# Patient Record
Sex: Female | Born: 1965
Health system: Southern US, Community
[De-identification: ages and names within clinical notes are randomized; demographics above are authoritative.]

## PROBLEM LIST (undated history)

## (undated) DIAGNOSIS — J302 Other seasonal allergic rhinitis: Secondary | ICD-10-CM

## (undated) DIAGNOSIS — F419 Anxiety disorder, unspecified: Secondary | ICD-10-CM

## (undated) DIAGNOSIS — I10 Essential (primary) hypertension: Secondary | ICD-10-CM

## (undated) DIAGNOSIS — E7800 Pure hypercholesterolemia, unspecified: Secondary | ICD-10-CM

## (undated) DIAGNOSIS — J329 Chronic sinusitis, unspecified: Secondary | ICD-10-CM

## (undated) DIAGNOSIS — H5210 Myopia, unspecified eye: Secondary | ICD-10-CM

## (undated) HISTORY — PX: NASAL SINUS SURGERY: SHX719

---

## 1998-09-25 ENCOUNTER — Other Ambulatory Visit: Admission: RE | Admit: 1998-09-25 | Discharge: 1998-09-25 | Payer: Self-pay | Admitting: Family Medicine

## 1998-09-27 ENCOUNTER — Ambulatory Visit (HOSPITAL_COMMUNITY): Admission: RE | Admit: 1998-09-27 | Discharge: 1998-09-27 | Payer: Self-pay | Admitting: Family Medicine

## 1998-09-27 ENCOUNTER — Encounter: Payer: Self-pay | Admitting: Family Medicine

## 1999-10-09 ENCOUNTER — Other Ambulatory Visit: Admission: RE | Admit: 1999-10-09 | Discharge: 1999-10-09 | Payer: Self-pay | Admitting: Internal Medicine

## 2000-10-18 ENCOUNTER — Other Ambulatory Visit: Admission: RE | Admit: 2000-10-18 | Discharge: 2000-10-18 | Payer: Self-pay | Admitting: Family Medicine

## 2001-06-27 ENCOUNTER — Emergency Department (HOSPITAL_COMMUNITY): Admission: EM | Admit: 2001-06-27 | Discharge: 2001-06-27 | Payer: Self-pay | Admitting: Emergency Medicine

## 2002-11-09 ENCOUNTER — Other Ambulatory Visit: Admission: RE | Admit: 2002-11-09 | Discharge: 2002-11-09 | Payer: Self-pay | Admitting: Family Medicine

## 2002-11-27 ENCOUNTER — Encounter: Admission: RE | Admit: 2002-11-27 | Discharge: 2002-11-27 | Payer: Self-pay | Admitting: Family Medicine

## 2003-02-07 ENCOUNTER — Emergency Department (HOSPITAL_COMMUNITY): Admission: EM | Admit: 2003-02-07 | Discharge: 2003-02-07 | Payer: Self-pay | Admitting: Emergency Medicine

## 2003-06-23 ENCOUNTER — Emergency Department (HOSPITAL_COMMUNITY): Admission: EM | Admit: 2003-06-23 | Discharge: 2003-06-23 | Payer: Self-pay | Admitting: Emergency Medicine

## 2003-08-06 ENCOUNTER — Encounter: Admission: RE | Admit: 2003-08-06 | Discharge: 2003-09-14 | Payer: Self-pay | Admitting: Orthopedic Surgery

## 2004-03-20 ENCOUNTER — Emergency Department (HOSPITAL_COMMUNITY): Admission: EM | Admit: 2004-03-20 | Discharge: 2004-03-20 | Payer: Self-pay | Admitting: Emergency Medicine

## 2009-10-17 ENCOUNTER — Encounter: Admission: RE | Admit: 2009-10-17 | Discharge: 2009-10-17 | Payer: Self-pay | Admitting: Family Medicine

## 2010-02-16 ENCOUNTER — Encounter: Payer: Self-pay | Admitting: Family Medicine

## 2010-05-23 ENCOUNTER — Other Ambulatory Visit: Payer: Self-pay | Admitting: Family Medicine

## 2010-05-23 ENCOUNTER — Ambulatory Visit
Admission: RE | Admit: 2010-05-23 | Discharge: 2010-05-23 | Disposition: A | Discharge status: Home / Self Care | Payer: 59 | Source: Ambulatory Visit | Attending: Family Medicine | Admitting: Family Medicine

## 2010-05-23 DIAGNOSIS — R52 Pain, unspecified: Secondary | ICD-10-CM

## 2010-05-23 DIAGNOSIS — R1031 Right lower quadrant pain: Secondary | ICD-10-CM

## 2010-05-23 MED ORDER — IOHEXOL 300 MG/ML  SOLN
100.0000 mL | Freq: Once | INTRAMUSCULAR | Status: AC | PRN
  Administered 2010-05-23: 100 mL via INTRAVENOUS

## 2011-02-05 ENCOUNTER — Encounter (HOSPITAL_COMMUNITY): Payer: Self-pay | Admitting: Emergency Medicine

## 2011-02-05 ENCOUNTER — Emergency Department (INDEPENDENT_AMBULATORY_CARE_PROVIDER_SITE_OTHER)
Admission: EM | Admit: 2011-02-05 | Discharge: 2011-02-05 | Disposition: A | Discharge status: Home / Self Care | Payer: 59 | Source: Home / Self Care | Attending: Family Medicine | Admitting: Family Medicine

## 2011-02-05 DIAGNOSIS — R599 Enlarged lymph nodes, unspecified: Secondary | ICD-10-CM

## 2011-02-05 MED ORDER — IBUPROFEN 800 MG PO TABS: 800.0000 mg | ORAL_TABLET | Freq: Three times a day (TID) | ORAL | Status: AC

## 2011-02-05 MED ORDER — AMOXICILLIN 500 MG PO CAPS: 500.0000 mg | ORAL_CAPSULE | Freq: Three times a day (TID) | ORAL | Status: AC

## 2011-02-05 NOTE — ED Provider Notes (Signed)
History     CSN: [id number]  Arrival date & time 02/05/11  1447   First MD Initiated Contact with Patient 02/05/11 1705      Chief Complaint  Patient presents with  . Otalgia    (Consider location/radiation/quality/duration/timing/severity/associated sxs/prior treatment) Patient is a 46 y.o. female presenting with ear pain. The history is provided by the patient. No language interpreter was used.  Otalgia This is a new problem. The current episode started 3 to 5 hours ago. There is pain in the right ear. The problem occurs constantly. The problem has been gradually worsening. There has been no fever. The pain is at a severity of 6/10. The pain is moderate. Pertinent negatives include no ear discharge. Her past medical history does not include chronic ear infection.  Pt complains of pain around right ear and below right ear.  History reviewed. No pertinent past medical history.  History reviewed. No pertinent past surgical history.  History reviewed. No pertinent family history.  History  Substance Use Topics  . Smoking status: Current Everyday Smoker -- 0.5 packs/day    Types: Cigarettes  . Smokeless tobacco: Not on file  . Alcohol Use: No    OB History    Grav Para Term Preterm Abortions TAB SAB Ect Mult Living                  Review of Systems  HENT: Positive for ear pain. Negative for ear discharge.   All other systems reviewed and are negative.    Allergies  Review of patient's allergies indicates no known allergies.  Home Medications  No current outpatient prescriptions on file.  BP 139/89  Pulse 66  Temp 97.2 F (36.2 C)  Resp 22  SpO2 100%  LMP 12/11/2010  Physical Exam  Vitals reviewed. Constitutional: She appears well-developed and well-nourished.  HENT:  Head: Normocephalic and atraumatic.  Right Ear: External ear normal.  Left Ear: External ear normal.  Nose: Nose normal.  Mouth/Throat: Oropharynx is clear and moist.  Eyes: Conjunctivae  and EOM are normal. Pupils are equal, round, and reactive to light.  Neck: Normal range of motion.  Cardiovascular: Normal rate.   Pulmonary/Chest: Effort normal.  Abdominal: Soft.  Musculoskeletal: Normal range of motion.  Lymphadenopathy:    She has cervical adenopathy.  Neurological: She is alert.  Skin: Skin is warm and dry.  Psychiatric: She has a normal mood and affect.    ED Course  Procedures (including critical care time)  Labs Reviewed - No data to display No results found.   No diagnosis found.    MDM  Rx for amoxicillian and ibuprofen        Langston Masker, [place] 02/05/11 1709

## 2011-02-05 NOTE — ED Notes (Signed)
Pt c/o right ear pain starting today. Also has some dizziness, nausea, and diarrhea.

## 2011-02-06 NOTE — ED Provider Notes (Signed)
Medical screening examination/treatment/procedure(s) were performed by non-physician practitioner and as supervising physician I was immediately available for consultation/collaboration.  Corrie Mckusick, MD 02/06/11 1015

## 2013-12-18 ENCOUNTER — Emergency Department (HOSPITAL_COMMUNITY)
Admission: EM | Admit: 2013-12-18 | Discharge: 2013-12-18 | Disposition: A | Discharge status: Home / Self Care | Payer: Managed Care, Other (non HMO) | Attending: Emergency Medicine | Admitting: Emergency Medicine

## 2013-12-18 ENCOUNTER — Emergency Department (INDEPENDENT_AMBULATORY_CARE_PROVIDER_SITE_OTHER)
Admission: EM | Admit: 2013-12-18 | Discharge: 2013-12-18 | Disposition: A | Discharge status: Home / Self Care | Payer: Managed Care, Other (non HMO) | Source: Home / Self Care | Attending: Family Medicine | Admitting: Family Medicine

## 2013-12-18 ENCOUNTER — Encounter (HOSPITAL_COMMUNITY): Payer: Self-pay | Admitting: Emergency Medicine

## 2013-12-18 ENCOUNTER — Encounter (HOSPITAL_COMMUNITY): Payer: Self-pay | Admitting: Family Medicine

## 2013-12-18 DIAGNOSIS — Z7952 Long term (current) use of systemic steroids: Secondary | ICD-10-CM | POA: Insufficient documentation

## 2013-12-18 DIAGNOSIS — R109 Unspecified abdominal pain: Secondary | ICD-10-CM

## 2013-12-18 DIAGNOSIS — R197 Diarrhea, unspecified: Secondary | ICD-10-CM | POA: Insufficient documentation

## 2013-12-18 DIAGNOSIS — Z87891 Personal history of nicotine dependence: Secondary | ICD-10-CM | POA: Diagnosis not present

## 2013-12-18 DIAGNOSIS — R112 Nausea with vomiting, unspecified: Secondary | ICD-10-CM | POA: Diagnosis not present

## 2013-12-18 DIAGNOSIS — R1031 Right lower quadrant pain: Secondary | ICD-10-CM

## 2013-12-18 DIAGNOSIS — Z79899 Other long term (current) drug therapy: Secondary | ICD-10-CM | POA: Insufficient documentation

## 2013-12-18 LAB — POCT URINALYSIS DIP (DEVICE)
Bilirubin Urine: NEGATIVE
GLUCOSE, UA: NEGATIVE mg/dL
Ketones, ur: NEGATIVE mg/dL
LEUKOCYTES UA: NEGATIVE
NITRITE: NEGATIVE
Protein, ur: NEGATIVE mg/dL
Specific Gravity, Urine: 1.015 (ref 1.005–1.030)
Urobilinogen, UA: 0.2 mg/dL (ref 0.0–1.0)
pH: 6 (ref 5.0–8.0)

## 2013-12-18 LAB — CBC WITH DIFFERENTIAL/PLATELET
BASOS ABS: 0 10*3/uL (ref 0.0–0.1)
Basophils Relative: 0 % (ref 0–1)
EOS ABS: 0.2 10*3/uL (ref 0.0–0.7)
Eosinophils Relative: 3 % (ref 0–5)
HCT: 41.2 % (ref 36.0–46.0)
Hemoglobin: 13.9 g/dL (ref 12.0–15.0)
LYMPHS ABS: 1.6 10*3/uL (ref 0.7–4.0)
LYMPHS PCT: 21 % (ref 12–46)
MCH: 30 pg (ref 26.0–34.0)
MCHC: 33.7 g/dL (ref 30.0–36.0)
MCV: 89 fL (ref 78.0–100.0)
Monocytes Absolute: 0.4 10*3/uL (ref 0.1–1.0)
Monocytes Relative: 6 % (ref 3–12)
NEUTROS PCT: 70 % (ref 43–77)
Neutro Abs: 5.4 10*3/uL (ref 1.7–7.7)
PLATELETS: 372 10*3/uL (ref 150–400)
RBC: 4.63 MIL/uL (ref 3.87–5.11)
RDW: 13.5 % (ref 11.5–15.5)
WBC: 7.7 10*3/uL (ref 4.0–10.5)

## 2013-12-18 LAB — COMPREHENSIVE METABOLIC PANEL
ALT: 9 U/L (ref 0–35)
AST: 19 U/L (ref 0–37)
Albumin: 3.9 g/dL (ref 3.5–5.2)
Alkaline Phosphatase: 86 U/L (ref 39–117)
Anion gap: 16 — ABNORMAL HIGH (ref 5–15)
BUN: 11 mg/dL (ref 6–23)
CALCIUM: 9.4 mg/dL (ref 8.4–10.5)
CO2: 22 meq/L (ref 19–32)
Chloride: 99 mEq/L (ref 96–112)
Creatinine, Ser: 0.66 mg/dL (ref 0.50–1.10)
GFR calc Af Amer: >90 mL/min (ref >90)
GFR calc non Af Amer: >90 mL/min (ref >90)
Glucose, Bld: 85 mg/dL (ref 70–99)
Potassium: 4.3 mEq/L (ref 3.7–5.3)
SODIUM: 137 meq/L (ref 137–147)
TOTAL PROTEIN: 8 g/dL (ref 6.0–8.3)
Total Bilirubin: 0.2 mg/dL — ABNORMAL LOW (ref 0.3–1.2)

## 2013-12-18 LAB — LIPASE, BLOOD: Lipase: 32 U/L (ref 11–59)

## 2013-12-18 MED ORDER — MORPHINE SULFATE 4 MG/ML IJ SOLN
4.0000 mg | Freq: Once | INTRAMUSCULAR | Status: AC
Start: 2013-12-18 — End: 2013-12-18
  Administered 2013-12-18: 4 mg via INTRAVENOUS
  Filled 2013-12-18: qty 1

## 2013-12-18 MED ORDER — ONDANSETRON HCL 4 MG/2ML IJ SOLN
4.0000 mg | Freq: Once | INTRAMUSCULAR | Status: AC
  Administered 2013-12-18: 4 mg via INTRAVENOUS
  Filled 2013-12-18: qty 2

## 2013-12-18 MED ORDER — ONDANSETRON 4 MG PO TBDP
4.0000 mg | ORAL_TABLET | Freq: Once | ORAL | Status: AC
  Administered 2013-12-18: 4 mg via ORAL

## 2013-12-18 MED ORDER — SODIUM CHLORIDE 0.9 % IV BOLUS (SEPSIS)
1000.0000 mL | Freq: Once | INTRAVENOUS | Status: AC
Start: 2013-12-18 — End: 2013-12-18
  Administered 2013-12-18: 1000 mL via INTRAVENOUS

## 2013-12-18 MED ORDER — ONDANSETRON 4 MG PO TBDP: 4.0000 mg | ORAL_TABLET | Freq: Three times a day (TID) | ORAL | Status: DC | PRN

## 2013-12-18 MED ORDER — OXYCODONE-ACETAMINOPHEN 5-325 MG PO TABS: 1.0000 | ORAL_TABLET | ORAL | Status: DC | PRN

## 2013-12-18 MED ORDER — ONDANSETRON 4 MG PO TBDP
ORAL_TABLET | ORAL | Status: AC
  Filled 2013-12-18: qty 1

## 2013-12-18 NOTE — ED Provider Notes (Signed)
CSN: [id number]     Arrival date & time 12/18/13  1359 History   First MD Initiated Contact with Patient 12/18/13 1505     Chief Complaint  Patient presents with  . Abdominal Pain     (Consider location/radiation/quality/duration/timing/severity/associated sxs/prior Treatment) The history is provided by the patient and medical records.    This is a 48 year old female with no significant past medical history presenting to the ED for abdominal cramping and diarrhea. Patient states symptoms initially started on 12/14/2013 with nausea, vomiting, and nonbloody diarrhea. States since this time vomiting has resolved but she remains nauseated. She continues having 4-5 watery stools a day.  Denies melena or hematochezia.  States yesterday she developed abdominal pain, worse in her right lower quadrant, but states pain is all throughout her abdomen.  Pain described as a constant, cramping sensation like her abdomen is "knotted up."  She denies fever but has had subjective chills.  Patient has been able to drink water and ginger ale, but has not has not had much of an appetite.  No prior abdominal surgeries.  VS stable on arrival.  History reviewed. No pertinent past medical history. Past Surgical History  Procedure Laterality Date  . Nasal sinus surgery     History reviewed. No pertinent family history. History  Substance Use Topics  . Smoking status: Former Smoker -- 0.50 packs/day    Types: Cigarettes  . Smokeless tobacco: Not on file  . Alcohol Use: No   OB History    No data available     Review of Systems  Gastrointestinal: Positive for nausea, vomiting and diarrhea.  All other systems reviewed and are negative.     Allergies  Review of patient's allergies indicates no known allergies.  Home Medications   Prior to Admission medications   Medication Sig Start Date End Date Taking? Authorizing Provider  cetirizine (ZYRTEC) 10 MG tablet Take 10 mg by mouth daily.    Historical  Provider, MD  fluticasone (FLONASE) 50 MCG/ACT nasal spray Place into both nostrils daily.    Historical Provider, MD   LMP 12/11/2010   Physical Exam  Constitutional: She is oriented to person, place, and time. She appears well-developed and well-nourished.  HENT:  Head: Normocephalic and atraumatic.  Mouth/Throat: Oropharynx is clear and moist.  Eyes: Conjunctivae and EOM are normal. Pupils are equal, round, and reactive to light.  Neck: Normal range of motion.  Cardiovascular: Normal rate, regular rhythm and normal heart sounds.   Pulmonary/Chest: Effort normal and breath sounds normal. No respiratory distress. She has no wheezes.  Abdominal: Soft. Bowel sounds are normal. There is generalized tenderness. There is no CVA tenderness, no tenderness at McBurney's point and negative Murphy's sign.  Abdomen soft, nondistended, generalized discomfort without focal tenderness; no tenderness at McBurney's point; no rebound or guarding  Musculoskeletal: Normal range of motion.  Neurological: She is alert and oriented to person, place, and time.  Skin: Skin is warm and dry.  Psychiatric: She has a normal mood and affect.  Nursing note and vitals reviewed.   ED Course  Procedures (including critical care time) Labs Review Labs Reviewed  COMPREHENSIVE METABOLIC PANEL - Abnormal; Notable for the following:    Total Bilirubin 0.2 (*)    Anion gap 16 (*)    All other components within normal limits  CBC WITH DIFFERENTIAL  LIPASE, BLOOD    Imaging Review No results found.   EKG Interpretation None      MDM   Final diagnoses:  Abdominal pain, unspecified abdominal location  Nausea vomiting and diarrhea   48 year old female with gastroenteritis type symptoms for the past 4 days now with abdominal pain. Seen in urgent care and sent to the ED for further evaluation. On exam, patient is afebrile and overall nontoxic in appearance. She has diffuse generalized abdominal discomfort  without focal tenderness. Specifically she has no tenderness at McBurney's point. Labs pending at this time. Patient given IV fluids, morphine, and Zofran. Will reassess.  Lab work reassuring, no leukocytosis or signs of dehydration. After medications, patient states she is feeling significantly better. She denies any abdominal discomfort at this time, abdominal exam benign at this time.  Discussed possibility of CT scan to ensure no abnormalities, patient states she is feeling better and is comfortable holding off at this time.  Will initiate by mouth challenge, likely discharge.  5:34 PM Patient reassessed.  She states she is feeling tremendously better.  She has been able to drink full can of gingerale and several crackers without further nausea or vomiting.  No diarrhea while in the ED.  Abdominal exam remains benign at this time, VS stable.  At this time, low suspicion for acute abdominal pathology including small bowel obstruction, appendicitis, diverticulitis, bowel perforation, intra-abdominal abscess, etc. Symptoms more likely due to gastroenteritis. I have again discussed the possibility of obtaining CT abdomen and pelvis to ensure no abnormalities, patient states she is feeling much better and is compared bone-holding on scan at this time. Patient instructed on supportive care home, encouraged brat diet and progress back to normal as tolerated. Rx Percocet and Zofran.  Discussed plan with patient, he/she acknowledged understanding and agreed with plan of care.  Return precautions given for new or worsening symptoms.   Larene Pickett, PA-C 12/18/13 2129  Mirna Mires, MD 12/25/13 0730

## 2013-12-18 NOTE — Discharge Instructions (Signed)
As we discussed, I have concerns about the possibility of your having acute appendicitis. Please transport yourself to [facility] ER upon leaving [facility] for additional evaluation. Please do not eat or drink anything along the way.  Abdominal Pain Many things can cause abdominal pain. Usually, abdominal pain is not caused by a disease and will improve without treatment. It can often be observed and treated at home. Your health care provider will do a physical exam and possibly order blood tests and X-rays to help determine the seriousness of your pain. However, in many cases, more time must pass before a clear cause of the pain can be found. Before that point, your health care provider may not know if you need more testing or further treatment. HOME CARE INSTRUCTIONS  Monitor your abdominal pain for any changes. The following actions may help to alleviate any discomfort you are experiencing:  Only take over-the-counter or prescription medicines as directed by your health care provider.  Do not take laxatives unless directed to do so by your health care provider.  Try a clear liquid diet (broth, tea, or water) as directed by your health care provider. Slowly move to a bland diet as tolerated. SEEK MEDICAL CARE IF:  You have unexplained abdominal pain.  You have abdominal pain associated with nausea or diarrhea.  You have pain when you urinate or have a bowel movement.  You experience abdominal pain that wakes you in the night.  You have abdominal pain that is worsened or improved by eating food.  You have abdominal pain that is worsened with eating fatty foods.  You have a fever. SEEK IMMEDIATE MEDICAL CARE IF:   Your pain does not go away within 2 hours.  You keep throwing up (vomiting).  Your pain is felt only in portions of the abdomen, such as the right side or the left lower portion of the abdomen.  You pass bloody or black tarry stools. MAKE SURE YOU:  Understand these  instructions.   Will watch your condition.   Will get help right away if you are not doing well or get worse.  Document Released: 10/22/2004 Document Revised: 01/17/2013 Document Reviewed: 09/21/2012 [facility] Patient Information 2015 [place], [place]. This information is not intended to replace advice given to you by your health care provider. Make sure you discuss any questions you have with your health care provider.

## 2013-12-18 NOTE — ED Notes (Signed)
Pt sts abdominal cramping ans diarrhea since Thursday and sts now RLQ pain and nausea. Sent here from [facility] r/o appy

## 2013-12-18 NOTE — ED Notes (Signed)
Pt is going to the ED to be further evaluated for Abdominal pain via private vechicle.

## 2013-12-18 NOTE — ED Provider Notes (Signed)
CSN: [id number]     Arrival date & time 12/18/13  1147 History   None    Chief Complaint  Patient presents with  . Abdominal Pain  . Nausea  . Diarrhea   (Consider location/radiation/quality/duration/timing/severity/associated sxs/prior Treatment) HPI Comments: Patient states she developed cramping abdominal pain with associated nausea, vomiting, diarrhea and anorexia on 12/14/2013. Reports she is no longer vomiting but remains nauseous and is having 4-5 diarrhea stools a day. Reports development of RLQ abdominal pain over the past 12-24 hours. NO fever. No bloody stools. Denies urinary issues. No vaginal issues Reports herself to be otherwise healthy No previous abdominal surgery Is menopausal PCP: [facility] Works in lab at [employer]  Patient is a 48 y.o. female presenting with abdominal pain and diarrhea. The history is provided by the patient.  Abdominal Pain Associated symptoms: diarrhea   Diarrhea Associated symptoms: abdominal pain     History reviewed. No pertinent past medical history. Past Surgical History  Procedure Laterality Date  . Nasal sinus surgery     History reviewed. No pertinent family history. History  Substance Use Topics  . Smoking status: Former Smoker -- 0.50 packs/day    Types: Cigarettes  . Smokeless tobacco: Not on file  . Alcohol Use: No   OB History    No data available     Review of Systems  Gastrointestinal: Positive for abdominal pain and diarrhea.  All other systems reviewed and are negative.   Allergies  Review of patient's allergies indicates no known allergies.  Home Medications   Prior to Admission medications   Medication Sig Start Date End Date Taking? Authorizing Provider  cetirizine (ZYRTEC) 10 MG tablet Take 10 mg by mouth daily.   Yes Historical Provider, MD  fluticasone (FLONASE) 50 MCG/ACT nasal spray Place into both nostrils daily.   Yes Historical Provider, MD   BP 116/63 mmHg  Pulse 63   Temp(Src) 98.6 F (37 C) (Oral)  Resp 16  SpO2 99%  LMP 12/11/2010 Physical Exam  Constitutional: She is oriented to person, place, and time. She appears well-developed and well-nourished. No distress.  HENT:  Head: Normocephalic and atraumatic.  Eyes: Conjunctivae are normal. No scleral icterus.  Cardiovascular: Normal rate, regular rhythm and normal heart sounds.   Pulmonary/Chest: Effort normal and breath sounds normal. No respiratory distress. She has no wheezes.  Abdominal: Soft. Normal appearance and bowel sounds are normal. There is tenderness in the right lower quadrant. There is no rigidity, no rebound, no guarding and no CVA tenderness.    Musculoskeletal: Normal range of motion.  Neurological: She is alert and oriented to person, place, and time.  Skin: Skin is warm and dry.  Psychiatric: She has a normal mood and affect. Her behavior is normal.  Nursing note and vitals reviewed.   ED Course  Procedures (including critical care time) Labs Review Labs Reviewed - No data to display  Imaging Review No results found.   MDM   1. Right lower quadrant abdominal pain    UA neg. Given 4mg  ODT zofran at [facility] for nausea I have concern about area of focal tenderness in RLQ in setting of nausea, anorexia and diarrhea.  Explained my concern regarding possibility of acute appendicitis to patient and she understand the need for additional evaluation. Would like to transport herself via private vehicle to [facility] ER for further evaluation. Advised to remain NPO.     Lutricia Feil, [place] 12/18/13 1357

## 2013-12-18 NOTE — ED Notes (Signed)
Pt states that she has had nausea diarrhea and abd cramping since 12/14/2013. Pt states only 1 episode of emesis on 12/14/2013.

## 2013-12-18 NOTE — Discharge Instructions (Signed)
Take the prescribed medication as directed. May wish to start with BRAT diet (bananas, rice, apples, toast) and progress back to normal as tolerated.  Drink plenty of fluids to stay hydrated. Return to the ED for new or worsening symptoms--- severe abdominal pain, uncontrollable nausea and vomiting, high fever, etc.

## 2014-04-25 ENCOUNTER — Encounter: Payer: Self-pay | Admitting: Medical

## 2014-04-25 ENCOUNTER — Ambulatory Visit (INDEPENDENT_AMBULATORY_CARE_PROVIDER_SITE_OTHER): Payer: 59 | Admitting: Medical

## 2014-04-25 VITALS — BP 120/80 | HR 70 | Temp 97.6°F | Resp 15 | Wt 176.0 lb

## 2014-04-25 DIAGNOSIS — J0100 Acute maxillary sinusitis, unspecified: Secondary | ICD-10-CM

## 2014-04-25 DIAGNOSIS — J309 Allergic rhinitis, unspecified: Secondary | ICD-10-CM

## 2014-04-25 MED ORDER — AMOXICILLIN 500 MG PO TABS: ORAL_TABLET | ORAL | Status: DC

## 2014-04-25 NOTE — Progress Notes (Signed)
  Subjective:  Maureen Hickman is a 49 y.o. female who presents as a new patient.  Here for evaluation and treatment of allergic symptoms. Symptoms include: itchy eyes, itchy nose, nasal congestion, postnasal drip, sinus pressure, sneezing and dizzy and some loose stool and are present year round, but sees changes at each season. Precipitants include: pollen. Treatment currently includes Flonase and Zyrtec.  Hx/o sinus surgery 11/ 2014.  Been on flonase since 11/2012.  Currently in last few days has right sinus pressure and upper teeth pain.  Sense of smell decreased, some fatigue.  Some yellow nasal mucous.  She notes prior to sinus surgery in 11/2012, seems to be on antibiotic every other month.  Does neti pot.  No other aggravating or relieving factors. No other complaint.  The following portions of the patient's history were reviewed and updated as appropriate: allergies, current medications, past family history, past medical history, past social history, past surgical history and problem list.  ROS as in subjective   Objective: BP 120/80 mmHg  Pulse 70  Temp(Src) 97.6 F (36.4 C) (Oral)  Resp 15  Wt 176 lb (79.833 kg)  LMP 12/11/2010  General appearance: Alert, WD/WN, no distress                             Skin: warm, no rash                           Head: +right maxillary sinus tenderness                            Eyes: conjunctiva normal, corneas clear, PERRLA                            Ears: pearly TMs, external ear canals normal                          Nose: septum midline, nares patent s/p surgery, no erythema,mucoid discharge                 Mouth/throat: MMM, tongue normal, no pharyngeal erythema or exudate                           Neck: supple, no adenopathy, no thyromegaly, non tender                         Lungs: CTA bilaterally, no wheezes, rales, or rhonchi   Assessment:   Encounter Diagnoses  Name Primary?  . Acute maxillary sinusitis, recurrence not  specified Yes  . Allergic rhinitis, unspecified allergic rhinitis type      Plan:   C/t zyrtec or try change to allegra, c/t flonase, good hydration, avoid triggers if possible Begin amoxicillin, nasal saline, and f/u if not much improved in 1-2 wk

## 2015-04-22 DIAGNOSIS — F419 Anxiety disorder, unspecified: Secondary | ICD-10-CM | POA: Diagnosis not present

## 2015-04-22 DIAGNOSIS — I10 Essential (primary) hypertension: Secondary | ICD-10-CM | POA: Diagnosis not present

## 2015-05-20 DIAGNOSIS — F419 Anxiety disorder, unspecified: Secondary | ICD-10-CM | POA: Diagnosis not present

## 2015-05-20 DIAGNOSIS — I10 Essential (primary) hypertension: Secondary | ICD-10-CM | POA: Diagnosis not present

## 2015-05-20 DIAGNOSIS — G43909 Migraine, unspecified, not intractable, without status migrainosus: Secondary | ICD-10-CM | POA: Diagnosis not present

## 2015-05-27 DIAGNOSIS — I10 Essential (primary) hypertension: Secondary | ICD-10-CM | POA: Diagnosis not present

## 2015-05-27 DIAGNOSIS — G43909 Migraine, unspecified, not intractable, without status migrainosus: Secondary | ICD-10-CM | POA: Diagnosis not present

## 2015-05-27 DIAGNOSIS — F419 Anxiety disorder, unspecified: Secondary | ICD-10-CM | POA: Diagnosis not present

## 2015-06-14 DIAGNOSIS — H524 Presbyopia: Secondary | ICD-10-CM | POA: Diagnosis not present

## 2015-06-14 DIAGNOSIS — H5212 Myopia, left eye: Secondary | ICD-10-CM | POA: Diagnosis not present

## 2015-06-14 DIAGNOSIS — H52221 Regular astigmatism, right eye: Secondary | ICD-10-CM | POA: Diagnosis not present

## 2015-07-16 DIAGNOSIS — J019 Acute sinusitis, unspecified: Secondary | ICD-10-CM | POA: Diagnosis not present

## 2015-09-05 DIAGNOSIS — I10 Essential (primary) hypertension: Secondary | ICD-10-CM | POA: Diagnosis not present

## 2015-09-05 DIAGNOSIS — G43909 Migraine, unspecified, not intractable, without status migrainosus: Secondary | ICD-10-CM | POA: Diagnosis not present

## 2015-09-05 DIAGNOSIS — F419 Anxiety disorder, unspecified: Secondary | ICD-10-CM | POA: Diagnosis not present

## 2015-09-05 DIAGNOSIS — N951 Menopausal and female climacteric states: Secondary | ICD-10-CM | POA: Diagnosis not present

## 2015-12-05 MED FILL — cloNIDine HCL 0.2 MG TABS: 0.2 | 30 days supply | Qty: 60 | Fill #0

## 2016-02-05 MED FILL — cloNIDine HCL 0.2 MG TABS: 0.2 | 30 days supply | Qty: 60 | Fill #1

## 2016-02-11 DIAGNOSIS — F419 Anxiety disorder, unspecified: Secondary | ICD-10-CM | POA: Diagnosis not present

## 2016-02-11 DIAGNOSIS — I10 Essential (primary) hypertension: Secondary | ICD-10-CM | POA: Diagnosis not present

## 2016-02-11 DIAGNOSIS — J019 Acute sinusitis, unspecified: Secondary | ICD-10-CM | POA: Diagnosis not present

## 2016-04-13 DIAGNOSIS — G43909 Migraine, unspecified, not intractable, without status migrainosus: Secondary | ICD-10-CM | POA: Diagnosis not present

## 2016-04-13 DIAGNOSIS — F419 Anxiety disorder, unspecified: Secondary | ICD-10-CM | POA: Diagnosis not present

## 2016-04-13 DIAGNOSIS — I10 Essential (primary) hypertension: Secondary | ICD-10-CM | POA: Diagnosis not present

## 2016-04-28 MED FILL — CEFUROXIME AXETIL 500 MG TA: 500 | 10 days supply | Qty: 20 | Fill #0

## 2016-04-28 MED FILL — FLUTICASONE PROP 50 MCG SPR: 50 | 60 days supply | Qty: 16 | Fill #0

## 2016-05-19 DIAGNOSIS — J019 Acute sinusitis, unspecified: Secondary | ICD-10-CM | POA: Diagnosis not present

## 2016-05-19 DIAGNOSIS — G43909 Migraine, unspecified, not intractable, without status migrainosus: Secondary | ICD-10-CM | POA: Diagnosis not present

## 2016-05-19 DIAGNOSIS — I10 Essential (primary) hypertension: Secondary | ICD-10-CM | POA: Diagnosis not present

## 2016-07-16 DIAGNOSIS — I10 Essential (primary) hypertension: Secondary | ICD-10-CM | POA: Diagnosis not present

## 2016-07-16 DIAGNOSIS — G43909 Migraine, unspecified, not intractable, without status migrainosus: Secondary | ICD-10-CM | POA: Diagnosis not present

## 2016-07-16 DIAGNOSIS — F419 Anxiety disorder, unspecified: Secondary | ICD-10-CM | POA: Diagnosis not present

## 2016-07-16 MED FILL — ALPRAZolam 0.5 MG TABS: 0.5 | 30 days supply | Qty: 90 | Fill #0

## 2016-08-13 MED FILL — FLUTICASONE PROP 50 MCG SPR: 50 | 60 days supply | Qty: 16 | Fill #1

## 2016-10-07 DIAGNOSIS — I10 Essential (primary) hypertension: Secondary | ICD-10-CM | POA: Diagnosis not present

## 2016-10-07 DIAGNOSIS — G43909 Migraine, unspecified, not intractable, without status migrainosus: Secondary | ICD-10-CM | POA: Diagnosis not present

## 2016-10-07 DIAGNOSIS — F419 Anxiety disorder, unspecified: Secondary | ICD-10-CM | POA: Diagnosis not present

## 2016-10-07 DIAGNOSIS — J019 Acute sinusitis, unspecified: Secondary | ICD-10-CM | POA: Diagnosis not present

## 2016-10-07 MED FILL — FLUTICASONE PROP 50 MCG SPR: 50 | 60 days supply | Qty: 16 | Fill #0

## 2016-10-07 MED FILL — NOREL AD TABLET: 4-10-325 | 10 days supply | Qty: 40 | Fill #0

## 2016-10-07 MED FILL — AMOXICILLIN 500 MG CAPSULE: 500 | 10 days supply | Qty: 30 | Fill #0

## 2016-10-07 MED FILL — PROMETHAZINE 6.25 MG/5 ML S: 6.25 | 7 days supply | Qty: 140 | Fill #0

## 2016-11-25 DIAGNOSIS — G43909 Migraine, unspecified, not intractable, without status migrainosus: Secondary | ICD-10-CM | POA: Diagnosis not present

## 2016-11-25 DIAGNOSIS — F419 Anxiety disorder, unspecified: Secondary | ICD-10-CM | POA: Diagnosis not present

## 2016-11-25 DIAGNOSIS — I10 Essential (primary) hypertension: Secondary | ICD-10-CM | POA: Diagnosis not present

## 2016-12-01 MED FILL — AMOXICILLIN 500 MG CAPSULE: 500 | 10 days supply | Qty: 30 | Fill #1

## 2016-12-01 MED FILL — NOREL AD TABLET: 4-10-325 | 10 days supply | Qty: 40 | Fill #1

## 2017-02-16 MED FILL — FLUTICASONE PROP 50 MCG SPR: 50 | 60 days supply | Qty: 16 | Fill #1

## 2017-02-25 DIAGNOSIS — J069 Acute upper respiratory infection, unspecified: Secondary | ICD-10-CM | POA: Diagnosis not present

## 2017-02-25 DIAGNOSIS — I10 Essential (primary) hypertension: Secondary | ICD-10-CM | POA: Diagnosis not present

## 2017-02-25 DIAGNOSIS — F419 Anxiety disorder, unspecified: Secondary | ICD-10-CM | POA: Diagnosis not present

## 2017-03-29 DIAGNOSIS — I10 Essential (primary) hypertension: Secondary | ICD-10-CM | POA: Diagnosis not present

## 2017-03-29 DIAGNOSIS — Z0000 Encounter for general adult medical examination without abnormal findings: Secondary | ICD-10-CM | POA: Diagnosis not present

## 2017-03-29 DIAGNOSIS — Z01419 Encounter for gynecological examination (general) (routine) without abnormal findings: Secondary | ICD-10-CM | POA: Diagnosis not present

## 2017-04-01 MED FILL — ATORVASTATIN 20 MG TABLET: 20 | 90 days supply | Qty: 90 | Fill #0

## 2017-04-19 MED FILL — PROMETHAZINE 6.25 MG/5 ML S: 6.25 | 7 days supply | Qty: 140 | Fill #1

## 2017-04-19 MED FILL — FLUTICASONE PROP 50 MCG SPR: 50 | 60 days supply | Qty: 16 | Fill #2

## 2017-05-17 DIAGNOSIS — F419 Anxiety disorder, unspecified: Secondary | ICD-10-CM | POA: Diagnosis not present

## 2017-05-17 DIAGNOSIS — I10 Essential (primary) hypertension: Secondary | ICD-10-CM | POA: Diagnosis not present

## 2017-05-17 DIAGNOSIS — G43909 Migraine, unspecified, not intractable, without status migrainosus: Secondary | ICD-10-CM | POA: Diagnosis not present

## 2017-05-17 MED FILL — ALPRAZolam 0.5 MG TABS: 0.5 | 30 days supply | Qty: 90 | Fill #0

## 2017-07-26 MED FILL — FLUTICASONE PROP 50 MCG SPR: 50 | 60 days supply | Qty: 16 | Fill #3

## 2017-07-26 MED FILL — ATORVASTATIN 20 MG TABLET: 20 | 30 days supply | Qty: 30 | Fill #1

## 2017-08-05 DIAGNOSIS — F419 Anxiety disorder, unspecified: Secondary | ICD-10-CM | POA: Diagnosis not present

## 2017-08-05 DIAGNOSIS — D2230 Melanocytic nevi of unspecified part of face: Secondary | ICD-10-CM | POA: Diagnosis not present

## 2017-08-05 DIAGNOSIS — I10 Essential (primary) hypertension: Secondary | ICD-10-CM | POA: Diagnosis not present

## 2017-08-05 MED FILL — ALPRAZolam 0.5 MG TABS: 0.5 | 30 days supply | Qty: 90 | Fill #0

## 2017-08-26 DIAGNOSIS — F419 Anxiety disorder, unspecified: Secondary | ICD-10-CM | POA: Diagnosis not present

## 2017-08-26 DIAGNOSIS — K529 Noninfective gastroenteritis and colitis, unspecified: Secondary | ICD-10-CM | POA: Diagnosis not present

## 2017-08-26 MED FILL — AMOXICILLIN 875 MG TABLET: 875 | 5 days supply | Qty: 10 | Fill #0

## 2017-08-26 MED FILL — ONDANSETRON ODT 8 MG TABLET: 8 | 5 days supply | Qty: 20 | Fill #0

## 2017-09-23 MED FILL — PEG-3350 SOLUTION: 420 | 1 days supply | Qty: 4000 | Fill #0

## 2017-10-18 DIAGNOSIS — F419 Anxiety disorder, unspecified: Secondary | ICD-10-CM | POA: Diagnosis not present

## 2017-10-18 DIAGNOSIS — E782 Mixed hyperlipidemia: Secondary | ICD-10-CM | POA: Diagnosis not present

## 2017-10-18 MED FILL — ALPRAZolam 0.5 MG TABS: 0.5 | 30 days supply | Qty: 90 | Fill #0

## 2017-10-18 MED FILL — ATORVASTATIN CALCIUM 20 MG: 20 | 30 days supply | Qty: 30 | Fill #0

## 2017-10-29 DIAGNOSIS — Z1211 Encounter for screening for malignant neoplasm of colon: Secondary | ICD-10-CM | POA: Diagnosis not present

## 2017-10-29 DIAGNOSIS — K648 Other hemorrhoids: Secondary | ICD-10-CM | POA: Diagnosis not present

## 2017-10-29 DIAGNOSIS — D126 Benign neoplasm of colon, unspecified: Secondary | ICD-10-CM | POA: Diagnosis not present

## 2017-10-29 DIAGNOSIS — K644 Residual hemorrhoidal skin tags: Secondary | ICD-10-CM | POA: Diagnosis not present

## 2017-10-29 DIAGNOSIS — K635 Polyp of colon: Secondary | ICD-10-CM | POA: Diagnosis not present

## 2017-11-18 MED FILL — ATORVASTATIN CALCIUM 20 MG: 20 | 30 days supply | Qty: 30 | Fill #1

## 2017-11-23 MED FILL — AMOXICILLIN 875 MG TABLET: 875 | 5 days supply | Qty: 10 | Fill #1

## 2017-12-27 MED FILL — ATORVASTATIN CALCIUM 20 MG: 20 | 30 days supply | Qty: 30 | Fill #2

## 2017-12-28 DIAGNOSIS — F419 Anxiety disorder, unspecified: Secondary | ICD-10-CM | POA: Diagnosis not present

## 2017-12-28 DIAGNOSIS — E782 Mixed hyperlipidemia: Secondary | ICD-10-CM | POA: Diagnosis not present

## 2017-12-28 DIAGNOSIS — I10 Essential (primary) hypertension: Secondary | ICD-10-CM | POA: Diagnosis not present

## 2017-12-28 MED FILL — ALPRAZolam 0.5 MG TABS: 0.5 | 30 days supply | Qty: 90 | Fill #0

## 2018-02-16 MED FILL — ATORVASTATIN CALCIUM 20 MG: 20 | 30 days supply | Qty: 30 | Fill #3

## 2018-03-01 DIAGNOSIS — E782 Mixed hyperlipidemia: Secondary | ICD-10-CM | POA: Diagnosis not present

## 2018-03-01 DIAGNOSIS — I10 Essential (primary) hypertension: Secondary | ICD-10-CM | POA: Diagnosis not present

## 2018-03-01 DIAGNOSIS — F419 Anxiety disorder, unspecified: Secondary | ICD-10-CM | POA: Diagnosis not present

## 2018-03-01 MED FILL — ALPRAZolam 0.5 MG TABS: 0.5 | 30 days supply | Qty: 90 | Fill #0

## 2018-05-17 DIAGNOSIS — J019 Acute sinusitis, unspecified: Secondary | ICD-10-CM | POA: Diagnosis not present

## 2018-05-17 DIAGNOSIS — F419 Anxiety disorder, unspecified: Secondary | ICD-10-CM | POA: Diagnosis not present

## 2018-05-17 DIAGNOSIS — I10 Essential (primary) hypertension: Secondary | ICD-10-CM | POA: Diagnosis not present

## 2018-08-24 DIAGNOSIS — I10 Essential (primary) hypertension: Secondary | ICD-10-CM | POA: Diagnosis not present

## 2018-08-24 DIAGNOSIS — F419 Anxiety disorder, unspecified: Secondary | ICD-10-CM | POA: Diagnosis not present

## 2018-08-24 DIAGNOSIS — G43009 Migraine without aura, not intractable, without status migrainosus: Secondary | ICD-10-CM | POA: Diagnosis not present

## 2018-08-30 DIAGNOSIS — F419 Anxiety disorder, unspecified: Secondary | ICD-10-CM | POA: Diagnosis not present

## 2018-08-30 DIAGNOSIS — I10 Essential (primary) hypertension: Secondary | ICD-10-CM | POA: Diagnosis not present

## 2018-08-30 DIAGNOSIS — E782 Mixed hyperlipidemia: Secondary | ICD-10-CM | POA: Diagnosis not present

## 2018-08-30 DIAGNOSIS — G43009 Migraine without aura, not intractable, without status migrainosus: Secondary | ICD-10-CM | POA: Diagnosis not present

## 2018-09-15 MED FILL — MOMETASONE FUROATE 50 MCG S: 50 | 30 days supply | Qty: 17 | Fill #0

## 2018-09-26 DIAGNOSIS — F419 Anxiety disorder, unspecified: Secondary | ICD-10-CM | POA: Diagnosis not present

## 2018-09-26 DIAGNOSIS — J019 Acute sinusitis, unspecified: Secondary | ICD-10-CM | POA: Diagnosis not present

## 2018-09-26 DIAGNOSIS — I10 Essential (primary) hypertension: Secondary | ICD-10-CM | POA: Diagnosis not present

## 2018-09-27 MED FILL — CLARITHROMYCIN 500 MG TAB: 500 | 7 days supply | Qty: 14 | Fill #0

## 2018-10-28 DIAGNOSIS — G43009 Migraine without aura, not intractable, without status migrainosus: Secondary | ICD-10-CM | POA: Diagnosis not present

## 2018-10-28 DIAGNOSIS — F419 Anxiety disorder, unspecified: Secondary | ICD-10-CM | POA: Diagnosis not present

## 2018-10-28 DIAGNOSIS — I10 Essential (primary) hypertension: Secondary | ICD-10-CM | POA: Diagnosis not present

## 2018-10-28 DIAGNOSIS — E782 Mixed hyperlipidemia: Secondary | ICD-10-CM | POA: Diagnosis not present

## 2018-11-03 MED FILL — MOMETASONE FUROATE 50 MCG S: 50 | 30 days supply | Qty: 17 | Fill #0

## 2018-11-03 MED FILL — CLARITHROMYCIN 500 MG TAB: 500 | 7 days supply | Qty: 14 | Fill #1

## 2018-11-17 DIAGNOSIS — H524 Presbyopia: Secondary | ICD-10-CM | POA: Diagnosis not present

## 2018-11-17 DIAGNOSIS — H5212 Myopia, left eye: Secondary | ICD-10-CM | POA: Diagnosis not present

## 2018-11-17 DIAGNOSIS — H52221 Regular astigmatism, right eye: Secondary | ICD-10-CM | POA: Diagnosis not present

## 2019-01-31 DIAGNOSIS — I10 Essential (primary) hypertension: Secondary | ICD-10-CM | POA: Diagnosis not present

## 2019-01-31 DIAGNOSIS — Z0000 Encounter for general adult medical examination without abnormal findings: Secondary | ICD-10-CM | POA: Diagnosis not present

## 2019-01-31 DIAGNOSIS — E782 Mixed hyperlipidemia: Secondary | ICD-10-CM | POA: Diagnosis not present

## 2019-01-31 DIAGNOSIS — Z13220 Encounter for screening for lipoid disorders: Secondary | ICD-10-CM | POA: Diagnosis not present

## 2019-01-31 DIAGNOSIS — Z13228 Encounter for screening for other metabolic disorders: Secondary | ICD-10-CM | POA: Diagnosis not present

## 2019-01-31 DIAGNOSIS — Z01419 Encounter for gynecological examination (general) (routine) without abnormal findings: Secondary | ICD-10-CM | POA: Diagnosis not present

## 2019-02-02 DIAGNOSIS — M25562 Pain in left knee: Secondary | ICD-10-CM | POA: Diagnosis not present

## 2019-02-20 DIAGNOSIS — G43009 Migraine without aura, not intractable, without status migrainosus: Secondary | ICD-10-CM | POA: Diagnosis not present

## 2019-02-20 DIAGNOSIS — E782 Mixed hyperlipidemia: Secondary | ICD-10-CM | POA: Diagnosis not present

## 2019-02-20 DIAGNOSIS — F419 Anxiety disorder, unspecified: Secondary | ICD-10-CM | POA: Diagnosis not present

## 2019-02-20 DIAGNOSIS — J019 Acute sinusitis, unspecified: Secondary | ICD-10-CM | POA: Diagnosis not present

## 2019-02-20 DIAGNOSIS — I10 Essential (primary) hypertension: Secondary | ICD-10-CM | POA: Diagnosis not present

## 2019-02-20 MED FILL — MOMETASONE FUROATE 50 MCG S: 50 | 30 days supply | Qty: 17 | Fill #0

## 2019-02-20 MED FILL — ROSUVASTATIN CALCIUM 40 MG: 40 | 30 days supply | Qty: 30 | Fill #0

## 2019-02-20 MED FILL — AMOXICILLIN 875 MG TABS: 875 | 5 days supply | Qty: 10 | Fill #0

## 2019-02-20 MED FILL — VASCEPA 1 GM CAPSULE: 1 | 30 days supply | Qty: 120 | Fill #0

## 2019-02-21 MED FILL — UBRELVY 50 MG TABS: 50 | 30 days supply | Qty: 16 | Fill #0

## 2019-04-12 MED FILL — UBRELVY 50 MG TABS: 50 | 30 days supply | Qty: 16 | Fill #1

## 2019-05-11 MED FILL — ROSUVASTATIN CALCIUM 40 MG: 40 | 30 days supply | Qty: 30 | Fill #1

## 2019-05-15 MED FILL — AMOXICILLIN 875 MG TABS: 875 | 5 days supply | Qty: 10 | Fill #1

## 2019-06-01 ENCOUNTER — Ambulatory Visit: Payer: 59 | Admitting: Sports Medicine

## 2019-06-01 ENCOUNTER — Other Ambulatory Visit: Payer: Self-pay

## 2019-06-01 VITALS — BP 132/90 | Ht 63.0 in | Wt 174.0 lb

## 2019-06-01 DIAGNOSIS — M25562 Pain in left knee: Secondary | ICD-10-CM

## 2019-06-01 NOTE — Progress Notes (Signed)
Maureen Hickman - 54 y.o. female MRN [id number]  Date of birth: November 30, 1965  SUBJECTIVE:   CC: left knee pain   54 year old female presenting with left knee pain for the past 5 months.  She reports that she was doing jumping jacks in late December, felt like she overextended her leg laterally, and had acute pain over her lateral knee.  She saw Dr. Berenice Primas at [facility] in early January and had an x-ray of knee that she reported was normal.  She had a corticosteroid injection in her knee at that time which provided relief for 1 month.  Lateral knee pain returned and now she is also having feeling of knee locking, catching, and giving out.  She is unable to walk her dog as she was before the injury.  She is now walking with a cane as she felt like she was unstable and favoring her left side.  She purchased a compression sleeve a few days ago and has been wearing that with improvement in pain and stability.  In February, she started to have tingling from her posterior buttocks down behind her leg to the outside of her left foot.  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 No weakness of left leg. No change in bowel or bladder function.  ROS: negative aside from positives in HPI   PMHx - Updated and reviewed.  Contributory factors include: Negative PSHx - Updated and reviewed.  Contributory factors include:  Negative FHx - Updated and reviewed.  Contributory factors include:  Negative Social Hx - Updated and reviewed. Contributory factors include: Negative Medications - reviewed   DATA REVIEWED: none  PHYSICAL EXAM:  VS: BP:132/90  HR: bpm  TEMP: ( )  RESP:   HT:5\' 3"  (160 cm)   WT:174 lb (78.9 kg)  BMI:30.83 PHYSICAL EXAM: Gen: NAD, alert, cooperative with exam, well-appearing HEENT: clear conjunctiva,  CV:  no edema, capillary refill brisk, normal rate Resp: non-labored Skin: no rashes, normal turgor  Neuro: no gross deficits.    Psych:  alert and oriented  Left Knee: - Inspection: no gross deformity. No swelling/effusion, erythema or bruising. Skin intact - Palpation: TTP over lateral joint line - ROM: full active ROM with flexion and extension in knee and hip- pain with flexion and feeling of knee catching - Strength: 5/5 strength - Neuro/vasc: NV intact - Special Tests: - LIGAMENTS: negative anterior and posterior drawer, no MCL or LCL laxity  -- MENISCUS: positive McMurray's, positive Thessaly  -- PF JOINT: nml patellar mobility bilaterally  Hips: normal ROM, negative FABER and FADIR bilaterally  Lumbar spine: - Inspection: no gross deformity or asymmetry, swelling or ecchymosis - Palpation: No TTP over the spinous processes, paraspinal muscles, or SI joints b/l - ROM: full active ROM of the lumbar spine in flexion and extension without pain - Strength: 5/5 strength of lower extremity in L4-S1 nerve root distributions b/l; normal gait - Neuro: sensation intact in the L4-S1 nerve root distribution b/l, 2+ L4 and S1 reflexes - Special testing: Negative straight leg raise, negative slump, negative Stork test, Negative FABER  MSK ultrasound knee: Images were obtained both in the transverse and longitudinal plane. Patellar and quadriceps tendons were well visualized with no abnormalities. No effusion. Medial meniscus with no abnormality Lateral meniscus abnormal in appearance with hypoechoic change in anterior aspect resembling a tear.  Impression: lateral meniscus appearing frayed with anterior tear  ASSESSMENT & PLAN:  54 year old female presenting with 5 months of left lateral knee pain with exam and ultrasound findings consistent with lateral meniscal tear.  She is having mechanical symptoms and persistent pain that has not improved with conservative management.  Will obtain MRI to evaluate for lateral meniscal tear.  In the interim, continue compression, ice, NSAIDs as needed for pain.  The sciatica  that she is experiencing is likely from gait alteration causing irritation in her back.  Provided low back and abdominal exercises to help with this.  Will call with results of MRI and next steps.  Patient seen and evaluated with the sports medicine fellow.  I agree with the above plan of care.  MRI specifically to rule out a lateral meniscal tear which may need arthroscopic intervention.  Phone follow-up with those results when available and we will delineate further treatment based on those findings.

## 2019-06-21 ENCOUNTER — Ambulatory Visit
Admission: RE | Admit: 2019-06-21 | Discharge: 2019-06-21 | Disposition: A | Discharge status: Home / Self Care | Payer: 59 | Source: Ambulatory Visit | Attending: Sports Medicine | Admitting: Sports Medicine

## 2019-06-21 ENCOUNTER — Other Ambulatory Visit: Payer: Self-pay

## 2019-06-21 DIAGNOSIS — M25562 Pain in left knee: Secondary | ICD-10-CM

## 2019-06-21 DIAGNOSIS — S83242A Other tear of medial meniscus, current injury, left knee, initial encounter: Secondary | ICD-10-CM | POA: Diagnosis not present

## 2019-06-23 ENCOUNTER — Telehealth: Payer: Self-pay | Admitting: Sports Medicine

## 2019-06-23 NOTE — Telephone Encounter (Signed)
  I spoke with the patient on the phone today after reviewing MRI findings of her left knee.  She has an early root tear through the posterior horn of the medial meniscus with incomplete meniscal detachment.  Conservative treatment to date has consisted of a cortisone injection and a compression sleeve.  Her symptoms are currently tolerable but I did explain that the next step in treatment would be to consider arthroscopy.  She has seen Dr. Berenice Primas in the past and she can follow-up with him to discuss this if her knee pain persists.  She will follow up with me as needed.

## 2019-06-29 ENCOUNTER — Telehealth: Payer: Self-pay | Admitting: *Deleted

## 2019-06-29 DIAGNOSIS — M25562 Pain in left knee: Secondary | ICD-10-CM

## 2019-06-29 NOTE — Telephone Encounter (Signed)
Order placed for ortho referral. Patient to call and schedule appt

## 2019-07-05 DIAGNOSIS — F419 Anxiety disorder, unspecified: Secondary | ICD-10-CM | POA: Diagnosis not present

## 2019-07-05 DIAGNOSIS — E782 Mixed hyperlipidemia: Secondary | ICD-10-CM | POA: Diagnosis not present

## 2019-07-05 DIAGNOSIS — I10 Essential (primary) hypertension: Secondary | ICD-10-CM | POA: Diagnosis not present

## 2019-07-05 DIAGNOSIS — G43009 Migraine without aura, not intractable, without status migrainosus: Secondary | ICD-10-CM | POA: Diagnosis not present

## 2019-07-06 ENCOUNTER — Ambulatory Visit: Payer: 59 | Admitting: Orthopaedic Surgery

## 2019-07-06 ENCOUNTER — Other Ambulatory Visit: Payer: Self-pay

## 2019-07-06 DIAGNOSIS — S83242D Other tear of medial meniscus, current injury, left knee, subsequent encounter: Secondary | ICD-10-CM

## 2019-07-06 DIAGNOSIS — M25562 Pain in left knee: Secondary | ICD-10-CM | POA: Diagnosis not present

## 2019-07-06 NOTE — Progress Notes (Signed)
Office Visit Note   Patient: Maureen Hickman           Date of Birth: Jan 10, 1966           MRN: [id number] Visit Date: 07/06/2019              Requested by: Thurman Coyer, DO [address],  [place] [address] PCP: Carlena Hurl, PA-C   Assessment & Plan: Visit Diagnoses:  1. Acute medial meniscus tear, left, subsequent encounter   2. Acute pain of left knee     Plan: Due to the patient's persistent right knee pain and instability symptoms combined with the fact that a MRI does show meniscal tear, we are recommending a left knee arthroscopy with partial medial meniscectomy.  I showed her knee model and explained in detail what the surgery involves.  I talked about the risk and benefits of surgery including the risk of arthritic changes to the knee.  I talked about her goals of hopefully decreasing the instability symptoms of her knee.  I talked about what to expect intraoperatively and postoperatively as well.  All questions and concerns were answered and addressed.  We will work on getting this scheduled in the near future.  I would then have her out of work for least a week minimum or longer if needed.  Follow-Up Instructions: Return for 1 week post-op.   Orders:  No orders of the defined types were placed in this encounter.  No orders of the defined types were placed in this encounter.     Procedures: No procedures performed   Clinical Data: No additional findings.   Subjective: Chief Complaint  Patient presents with  . 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.  She is very active and has never had knee issues before.  She injured her left knee while she is performing jumping jacks and exercising.  Since then she has had locking catching with the left knee and instability symptoms.  She has had a steroid injection in her knee and it did last about a month but now her knee is  still locking catching on her and seems to be getting worse.  It swells on occasion.  Pivoting activities because a lot of pain in her left knee.  She has work on Forensic scientist exercises as well.  Given her continued recurrent instability symptoms a MRI was obtained of her left knee.  It does show a medial meniscal tear at the root of the meniscus.  The cartilage is intact throughout her knee otherwise.  Given her continued mechanical symptoms, she is hoping for an arthroscopic intervention at this point.  She denies any acute change in medical status.  HPI  Review of Systems She currently denies any headache, chest pain, shortness of breath, fever, chills, nausea, vomiting  Objective: Vital Signs: LMP 12/11/2010   Physical Exam She is alert and orient x3 and in no acute distress Ortho Exam Examination of her left knee shows a positive Murray sign to the medial compartment.  She also has lateral compartment pain.  The knee is ligamentously stable with good range of motion.  There is no effusion.  It is painful past 90 degrees of flexion. Specialty Comments:  No specialty comments available.  Imaging: No results found. The MRI is reviewed and does show intact cartilage throughout her right knee.  However, there is a medial  meniscal tear at the root of the meniscus.  PMFS History: There are no problems to display for this patient.  No past medical history on file.  No family history on file.  Past Surgical History:  Procedure Laterality Date  . NASAL SINUS SURGERY     Social History   Occupational History  . Not on file  Tobacco Use  . Smoking status: Former Smoker    Packs/day: 0.50    Types: Cigarettes  Substance and Sexual Activity  . Alcohol use: No  . Drug use: No  . Sexual activity: Not on file

## 2019-07-11 ENCOUNTER — Other Ambulatory Visit: Payer: Self-pay

## 2019-07-12 ENCOUNTER — Telehealth: Payer: Self-pay | Admitting: Orthopaedic Surgery

## 2019-07-12 NOTE — Telephone Encounter (Signed)
[employer] form received. Sent to [employer].

## 2019-07-19 ENCOUNTER — Encounter (HOSPITAL_BASED_OUTPATIENT_CLINIC_OR_DEPARTMENT_OTHER): Payer: Self-pay | Admitting: Orthopaedic Surgery

## 2019-07-19 ENCOUNTER — Other Ambulatory Visit: Payer: Self-pay

## 2019-07-19 ENCOUNTER — Other Ambulatory Visit: Payer: Self-pay | Admitting: Physician Assistant

## 2019-07-24 ENCOUNTER — Other Ambulatory Visit (HOSPITAL_COMMUNITY)
Admission: RE | Admit: 2019-07-24 | Discharge: 2019-07-24 | Disposition: A | Discharge status: Home / Self Care | Payer: 59 | Source: Ambulatory Visit | Attending: Orthopaedic Surgery | Admitting: Orthopaedic Surgery

## 2019-07-24 DIAGNOSIS — Z20822 Contact with and (suspected) exposure to covid-19: Secondary | ICD-10-CM | POA: Insufficient documentation

## 2019-07-24 DIAGNOSIS — Z01812 Encounter for preprocedural laboratory examination: Secondary | ICD-10-CM | POA: Diagnosis not present

## 2019-07-24 LAB — SARS CORONAVIRUS 2 (TAT 6-24 HRS): SARS Coronavirus 2: NEGATIVE

## 2019-07-26 NOTE — Anesthesia Preprocedure Evaluation (Addendum)
Anesthesia Evaluation  Patient identified by MRN, date of birth, ID band Patient awake    Reviewed: Allergy & Precautions, NPO status , Patient's Chart, lab work & pertinent test results  Airway Mallampati: II  TM Distance: >3 FB Neck ROM: Full    Dental  (+) Chipped,    Pulmonary former smoker,    Pulmonary exam normal breath sounds clear to auscultation       Cardiovascular hypertension, Normal cardiovascular exam Rhythm:Regular Rate:Normal     Neuro/Psych Anxiety negative neurological ROS     GI/Hepatic negative GI ROS, Neg liver ROS,   Endo/Other  negative endocrine ROS  Renal/GU negative Renal ROS     Musculoskeletal negative musculoskeletal ROS (+)   Abdominal   Peds  Hematology HLD   Anesthesia Other Findings left knee acute medial meniscal tear  Reproductive/Obstetrics                            Anesthesia Physical Anesthesia Plan  ASA: II  Anesthesia Plan: General   Post-op Pain Management:    Induction: Intravenous  PONV Risk Score and Plan: 3 and Ondansetron, Dexamethasone, Midazolam and Treatment may vary due to age or medical condition  Airway Management Planned: LMA  Additional Equipment:   Intra-op Plan:   Post-operative Plan: Extubation in OR  Informed Consent: I have reviewed the patients History and Physical, chart, labs and discussed the procedure including the risks, benefits and alternatives for the proposed anesthesia with the patient or authorized representative who has indicated his/her understanding and acceptance.     Dental advisory given  Plan Discussed with: CRNA  Anesthesia Plan Comments: (Potential regional anesthesia discussed)       Anesthesia Quick Evaluation

## 2019-07-27 ENCOUNTER — Other Ambulatory Visit: Payer: Self-pay

## 2019-07-27 ENCOUNTER — Encounter (HOSPITAL_BASED_OUTPATIENT_CLINIC_OR_DEPARTMENT_OTHER)
Admission: RE | Disposition: A | Discharge status: Home / Self Care | Payer: Self-pay | Source: Home / Self Care | Attending: Orthopaedic Surgery

## 2019-07-27 ENCOUNTER — Ambulatory Visit (HOSPITAL_BASED_OUTPATIENT_CLINIC_OR_DEPARTMENT_OTHER)
Admission: RE | Admit: 2019-07-27 | Discharge: 2019-07-27 | Disposition: A | Discharge status: Home / Self Care | Payer: 59 | Attending: Orthopaedic Surgery | Admitting: Orthopaedic Surgery

## 2019-07-27 ENCOUNTER — Encounter (HOSPITAL_BASED_OUTPATIENT_CLINIC_OR_DEPARTMENT_OTHER): Payer: Self-pay | Admitting: Orthopaedic Surgery

## 2019-07-27 ENCOUNTER — Ambulatory Visit (HOSPITAL_BASED_OUTPATIENT_CLINIC_OR_DEPARTMENT_OTHER): Payer: 59 | Admitting: Certified Registered"

## 2019-07-27 DIAGNOSIS — E7800 Pure hypercholesterolemia, unspecified: Secondary | ICD-10-CM | POA: Insufficient documentation

## 2019-07-27 DIAGNOSIS — I10 Essential (primary) hypertension: Secondary | ICD-10-CM | POA: Diagnosis not present

## 2019-07-27 DIAGNOSIS — S83242A Other tear of medial meniscus, current injury, left knee, initial encounter: Secondary | ICD-10-CM | POA: Diagnosis not present

## 2019-07-27 DIAGNOSIS — M2242 Chondromalacia patellae, left knee: Secondary | ICD-10-CM | POA: Insufficient documentation

## 2019-07-27 DIAGNOSIS — Z79899 Other long term (current) drug therapy: Secondary | ICD-10-CM | POA: Insufficient documentation

## 2019-07-27 DIAGNOSIS — S83242D Other tear of medial meniscus, current injury, left knee, subsequent encounter: Secondary | ICD-10-CM

## 2019-07-27 DIAGNOSIS — Z87891 Personal history of nicotine dependence: Secondary | ICD-10-CM | POA: Insufficient documentation

## 2019-07-27 DIAGNOSIS — F419 Anxiety disorder, unspecified: Secondary | ICD-10-CM | POA: Insufficient documentation

## 2019-07-27 DIAGNOSIS — M94262 Chondromalacia, left knee: Secondary | ICD-10-CM | POA: Diagnosis not present

## 2019-07-27 DIAGNOSIS — X503XXA Overexertion from repetitive movements, initial encounter: Secondary | ICD-10-CM | POA: Diagnosis not present

## 2019-07-27 HISTORY — DX: Essential (primary) hypertension: I10

## 2019-07-27 HISTORY — DX: Anxiety disorder, unspecified: F41.9

## 2019-07-27 HISTORY — DX: Pure hypercholesterolemia, unspecified: E78.00

## 2019-07-27 HISTORY — DX: Other seasonal allergic rhinitis: J30.2

## 2019-07-27 HISTORY — DX: Myopia, unspecified eye: H52.10

## 2019-07-27 HISTORY — PX: KNEE ARTHROSCOPY: SHX127

## 2019-07-27 HISTORY — DX: Chronic sinusitis, unspecified: J32.9

## 2019-07-27 HISTORY — PX: CHONDROPLASTY: SHX5177

## 2019-07-27 SURGERY — ARTHROSCOPY, KNEE
Anesthesia: General | Site: Knee | Laterality: Left

## 2019-07-27 MED ORDER — CEFAZOLIN SODIUM-DEXTROSE 2-4 GM/100ML-% IV SOLN
2.0000 g | INTRAVENOUS | Status: AC
  Administered 2019-07-27: 2 g via INTRAVENOUS

## 2019-07-27 MED ORDER — FENTANYL CITRATE (PF) 100 MCG/2ML IJ SOLN
INTRAMUSCULAR | Status: AC
  Filled 2019-07-27: qty 2

## 2019-07-27 MED ORDER — PROPOFOL 10 MG/ML IV BOLUS
INTRAVENOUS | Status: DC | PRN
  Administered 2019-07-27: 200 mg via INTRAVENOUS

## 2019-07-27 MED ORDER — EPHEDRINE 5 MG/ML INJ
INTRAVENOUS | Status: AC
  Filled 2019-07-27: qty 20

## 2019-07-27 MED ORDER — LIDOCAINE HCL (PF) 1 % IJ SOLN
INTRAMUSCULAR | Status: AC
  Filled 2019-07-27: qty 30

## 2019-07-27 MED ORDER — ONDANSETRON HCL 4 MG/2ML IJ SOLN
INTRAMUSCULAR | Status: AC
  Filled 2019-07-27: qty 8

## 2019-07-27 MED ORDER — BUPIVACAINE HCL (PF) 0.25 % IJ SOLN
INTRAMUSCULAR | Status: AC
  Filled 2019-07-27: qty 30

## 2019-07-27 MED ORDER — LIDOCAINE HCL (CARDIAC) PF 100 MG/5ML IV SOSY
PREFILLED_SYRINGE | INTRAVENOUS | Status: DC | PRN
  Administered 2019-07-27: 60 mg via INTRAVENOUS

## 2019-07-27 MED ORDER — PROMETHAZINE HCL 25 MG/ML IJ SOLN
INTRAMUSCULAR | Status: AC
  Filled 2019-07-27: qty 1

## 2019-07-27 MED ORDER — SODIUM CHLORIDE 0.9 % IR SOLN
Status: DC | PRN
  Administered 2019-07-27: 3000 mL

## 2019-07-27 MED ORDER — LACTATED RINGERS IV SOLN: INTRAVENOUS | Status: DC

## 2019-07-27 MED ORDER — OXYCODONE HCL 5 MG PO TABS: 5.0000 mg | ORAL_TABLET | Freq: Once | ORAL | Status: DC | PRN

## 2019-07-27 MED ORDER — DEXAMETHASONE SODIUM PHOSPHATE 10 MG/ML IJ SOLN
INTRAMUSCULAR | Status: DC | PRN
  Administered 2019-07-27: 4 mg via INTRAVENOUS

## 2019-07-27 MED ORDER — ACETAMINOPHEN 500 MG PO TABS
ORAL_TABLET | ORAL | Status: AC
  Filled 2019-07-27: qty 2

## 2019-07-27 MED ORDER — OXYCODONE HCL 5 MG/5ML PO SOLN: 5.0000 mg | Freq: Once | ORAL | Status: DC | PRN

## 2019-07-27 MED ORDER — PHENYLEPHRINE HCL (PRESSORS) 10 MG/ML IV SOLN
INTRAVENOUS | Status: AC
  Filled 2019-07-27: qty 2

## 2019-07-27 MED ORDER — HYDROMORPHONE HCL 1 MG/ML IJ SOLN: 0.2500 mg | INTRAMUSCULAR | Status: DC | PRN

## 2019-07-27 MED ORDER — ONDANSETRON HCL 4 MG/2ML IJ SOLN
INTRAMUSCULAR | Status: DC | PRN
  Administered 2019-07-27: 4 mg via INTRAVENOUS

## 2019-07-27 MED ORDER — BUPIVACAINE HCL (PF) 0.5 % IJ SOLN
INTRAMUSCULAR | Status: AC
  Filled 2019-07-27: qty 30

## 2019-07-27 MED ORDER — PROMETHAZINE HCL 25 MG/ML IJ SOLN
6.2500 mg | INTRAMUSCULAR | Status: DC | PRN
  Administered 2019-07-27: 6.25 mg via INTRAVENOUS

## 2019-07-27 MED ORDER — BUPIVACAINE HCL (PF) 0.5 % IJ SOLN
INTRAMUSCULAR | Status: DC | PRN
  Administered 2019-07-27: 20 mL via INTRA_ARTICULAR

## 2019-07-27 MED ORDER — MIDAZOLAM HCL 5 MG/5ML IJ SOLN
INTRAMUSCULAR | Status: DC | PRN
  Administered 2019-07-27: 2 mg via INTRAVENOUS

## 2019-07-27 MED ORDER — PROPOFOL 500 MG/50ML IV EMUL
INTRAVENOUS | Status: AC
  Filled 2019-07-27: qty 100

## 2019-07-27 MED ORDER — HYDROCODONE-ACETAMINOPHEN 5-325 MG PO TABS: 1.0000 | ORAL_TABLET | Freq: Four times a day (QID) | ORAL | 0 refills | Status: AC | PRN

## 2019-07-27 MED ORDER — MIDAZOLAM HCL 2 MG/2ML IJ SOLN
INTRAMUSCULAR | Status: AC
  Filled 2019-07-27: qty 2

## 2019-07-27 MED ORDER — MORPHINE SULFATE (PF) 4 MG/ML IV SOLN
INTRAVENOUS | Status: DC | PRN
  Administered 2019-07-27: 4 mg

## 2019-07-27 MED ORDER — FENTANYL CITRATE (PF) 100 MCG/2ML IJ SOLN
INTRAMUSCULAR | Status: DC | PRN
  Administered 2019-07-27 (×2): 50 ug via INTRAVENOUS

## 2019-07-27 MED ORDER — KETOROLAC TROMETHAMINE 30 MG/ML IJ SOLN: 30.0000 mg | Freq: Once | INTRAMUSCULAR | Status: DC | PRN

## 2019-07-27 MED ORDER — PHENYLEPHRINE 40 MCG/ML (10ML) SYRINGE FOR IV PUSH (FOR BLOOD PRESSURE SUPPORT)
PREFILLED_SYRINGE | INTRAVENOUS | Status: AC
  Filled 2019-07-27: qty 20

## 2019-07-27 MED ORDER — ACETAMINOPHEN 500 MG PO TABS
1000.0000 mg | ORAL_TABLET | Freq: Once | ORAL | Status: AC
  Administered 2019-07-27: 1000 mg via ORAL

## 2019-07-27 MED ORDER — MORPHINE SULFATE (PF) 4 MG/ML IV SOLN
INTRAVENOUS | Status: AC
  Filled 2019-07-27: qty 1

## 2019-07-27 MED ORDER — DEXAMETHASONE SODIUM PHOSPHATE 10 MG/ML IJ SOLN
INTRAMUSCULAR | Status: AC
  Filled 2019-07-27: qty 2

## 2019-07-27 MED FILL — HYDROCODON-APAP 5-325: 5-325 | 4 days supply | Qty: 30 | Fill #0

## 2019-07-27 SURGICAL SUPPLY — 37 items
BLADE EXCALIBUR 4.0X13 (MISCELLANEOUS) ×2 IMPLANT
BNDG ELASTIC 6X5.8 VLCR STR LF (GAUZE/BANDAGES/DRESSINGS) ×2 IMPLANT
COVER WAND RF STERILE (DRAPES) IMPLANT
DISSECTOR  3.8MM X 13CM (MISCELLANEOUS)
DISSECTOR 3.8MM X 13CM (MISCELLANEOUS) IMPLANT
DRAPE ARTHROSCOPY W/POUCH 90 (DRAPES) ×2 IMPLANT
DRAPE U-SHAPE 47X51 STRL (DRAPES) ×2 IMPLANT
DRSG PAD ABDOMINAL 8X10 ST (GAUZE/BANDAGES/DRESSINGS) ×2 IMPLANT
DURAPREP 26ML APPLICATOR (WOUND CARE) ×2 IMPLANT
ELECT MENISCUS 165MM 90D (ELECTRODE) IMPLANT
ELECT REM PT RETURN 9FT ADLT (ELECTROSURGICAL)
ELECTRODE REM PT RTRN 9FT ADLT (ELECTROSURGICAL) IMPLANT
EXCALIBUR 3.8MM X 13CM (MISCELLANEOUS) ×2 IMPLANT
GAUZE SPONGE 4X4 12PLY STRL (GAUZE/BANDAGES/DRESSINGS) ×2 IMPLANT
GAUZE XEROFORM 1X8 LF (GAUZE/BANDAGES/DRESSINGS) ×2 IMPLANT
GLOVE BIOGEL M 6.5 STRL (GLOVE) ×2 IMPLANT
GLOVE BIOGEL PI IND STRL 7.0 (GLOVE) ×3 IMPLANT
GLOVE BIOGEL PI IND STRL 8 (GLOVE) ×2 IMPLANT
GLOVE BIOGEL PI INDICATOR 7.0 (GLOVE) ×3
GLOVE BIOGEL PI INDICATOR 8 (GLOVE) ×2
GLOVE ORTHO TXT STRL SZ7.5 (GLOVE) ×2 IMPLANT
GLOVE SURG ORTHO 8.0 STRL STRW (GLOVE) ×2 IMPLANT
GLOVE SURG SS PI 7.0 STRL IVOR (GLOVE) ×2 IMPLANT
GOWN STRL REUS W/ TWL LRG LVL3 (GOWN DISPOSABLE) ×2 IMPLANT
GOWN STRL REUS W/ TWL XL LVL3 (GOWN DISPOSABLE) ×2 IMPLANT
GOWN STRL REUS W/TWL LRG LVL3 (GOWN DISPOSABLE) ×4
GOWN STRL REUS W/TWL XL LVL3 (GOWN DISPOSABLE) ×4
KNEE WRAP E Z 3 GEL PACK (MISCELLANEOUS) ×2 IMPLANT
MANIFOLD NEPTUNE II (INSTRUMENTS) ×2 IMPLANT
PACK BASIN DAY SURGERY FS (CUSTOM PROCEDURE TRAY) ×2 IMPLANT
PACK DSU ARTHROSCOPY (CUSTOM PROCEDURE TRAY) ×2 IMPLANT
PADDING CAST COTTON 6X4 STRL (CAST SUPPLIES) ×2 IMPLANT
PENCIL SMOKE EVACUATOR (MISCELLANEOUS) IMPLANT
SUT ETHILON 3 0 PS 1 (SUTURE) ×2 IMPLANT
TOWEL GREEN STERILE FF (TOWEL DISPOSABLE) ×2 IMPLANT
TUBING ARTHROSCOPY IRRIG 16FT (MISCELLANEOUS) ×2 IMPLANT
WATER STERILE IRR 1000ML POUR (IV SOLUTION) ×2 IMPLANT

## 2019-07-27 NOTE — Discharge Instructions (Signed)
You may increase your activities as comfort allows. You may put all of your weight on your left knee as comfort allows. Expect knee swelling -ice and elevation as needed. Tomorrow, you may remove all of your dressings and get your incisions wet in the shower daily. After you shower, you can place small Band-Aids over your incisions daily.  Call your surgeon if you experience:   1.  Fever over 101.0. 2.  Inability to urinate. 3.  Nausea and/or vomiting. 4.  Extreme swelling or bruising at the surgical site. 5.  Continued bleeding from the incision. 6.  Increased pain, redness or drainage from the incision. 7.  Problems related to your pain medication. 8.  Any problems and/or concerns   YOU RECEIVED TYLENOL AT 6:30 AM, YOU MAY REPEAT TYLENOL AGAIN AT 12:30 PM IF NEEDED.   Post Anesthesia Home Care Instructions  Activity: Get plenty of rest for the remainder of the day. A responsible individual must stay with you for 24 hours following the procedure.  For the next 24 hours, DO NOT: -Drive a car -Paediatric nurse -Drink alcoholic beverages -Take any medication unless instructed by your physician -Make any legal decisions or sign important papers.  Meals: Start with liquid foods such as gelatin or soup. Progress to regular foods as tolerated. Avoid greasy, spicy, heavy foods. If nausea and/or vomiting occur, drink only clear liquids until the nausea and/or vomiting subsides. Call your physician if vomiting continues.  Special Instructions/Symptoms: Your throat may feel dry or sore from the anesthesia or the breathing tube placed in your throat during surgery. If this causes discomfort, gargle with warm salt water. The discomfort should disappear within 24 hours.  If you had a scopolamine patch placed behind your ear for the management of post- operative nausea and/or vomiting:  1. The medication in the patch is effective for 72 hours, after which it should be removed.  Wrap patch  in a tissue and discard in the trash. Wash hands thoroughly with soap and water. 2. You may remove the patch earlier than 72 hours if you experience unpleasant side effects which may include dry mouth, dizziness or visual disturbances. 3. Avoid touching the patch. Wash your hands with soap and water after contact with the patch.

## 2019-07-27 NOTE — Brief Op Note (Signed)
07/27/2019  8:13 AM  PATIENT:  Esmond Camper  54 y.o. female  PRE-OPERATIVE DIAGNOSIS:  left knee acute medial meniscal tear  POST-OPERATIVE DIAGNOSIS:  Chondromalacia PROCEDURE:  Procedure(s): LEFT KNEE ARTHROSCOPY (Left) CHONDROPLASTY (Left)  SURGEON:  Surgeon(s) and Role:    Mcarthur Rossetti, MD - Primary  ANESTHESIA:   local and general  COUNTS:  YES  DICTATION: .Other Dictation: Dictation Number [id number]  PLAN OF CARE: Discharge to home after PACU  PATIENT DISPOSITION:  PACU - hemodynamically stable.   Delay start of Pharmacological VTE agent (>24hrs) due to surgical blood loss or risk of bleeding: no

## 2019-07-27 NOTE — Op Note (Signed)
NAME: Maureen Hickman, Maureen Hickman MEDICAL RECORD OV:[id number] ACCOUNT [id number] DATE OF BIRTH:[birth year] FACILITY: [facility] LOCATION: [facility] PHYSICIAN:Haru Shaff Kerry Fort, MD  OPERATIVE REPORT  DATE OF PROCEDURE:  07/27/2019  PREOPERATIVE DIAGNOSIS:  Left knee small posterior horn medial meniscal tear.  POSTOPERATIVE DIAGNOSES:   1.  Small meniscal root tear, left medial meniscus. 2.  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  PROCEDURE:  Left knee arthroscopy with debridement and abrasion chondroplasty.  SURGEON:  Lind Guest. Ninfa Linden, MD  ASSISTANT:  OR staff  ANESTHESIA:   1.  General. 2.  Local with mixture of 4 mg of morphine with 0.25% plain Marcaine.  ESTIMATED BLOOD LOSS:  Minimal.  COMPLICATIONS:  None.  INDICATIONS:  The patient is a 54 year old female who works in the [employer] system.  She was doing aerobic activities with jumping jacks earlier this year.  She came down on her knee awkwardly and since then has been having locking and catching and swelling.   She failed conservative treatment with anti-inflammatories and activity modification as well as rest.  We tried steroid injections as well.  After persistent mechanical symptoms an MRI was obtained.  It did show some thinning of the articular cartilage  in the medial compartment of the knee, but no full thickness cartilage tears.  There was a meniscal root tear as well of the medial meniscus.  At this point, an arthroscopic intervention was recommended.  She had a long and thorough discussion with him  about this.  We talked about the risks and benefits of surgery as well.  DESCRIPTION OF PROCEDURE:  After informed consent was obtained and appropriate left knee was marked, she was brought to the operating room and placed supine on the operating table.  General anesthesia was then obtained.  Her left thigh, knee, leg, ankle  and foot were prepped and  draped with DuraPrep and sterile drapes.  The bed was raised and a lateral leg post utilized.  The left operative knee was flexed off the side table.  A timeout was called to identify correct patient, correct left knee.  I then  made an anterolateral arthroscopy portal and inserted a cannula knee and did not find any significant effusion.  I placed the camera in the knee went to the medial compartment and right away, we could see there was an area of almost near full thickness  cartilage loss with some flaking of the cartilage on the medial femoral condyle.  Using arthroscopic shaver through an anterior medial portal I performed an abrasion chondroplasty in this area and got a small area of bleeding bone.  I then probed the  meniscus and there was a very small meniscal root tear, but there was no detachment of the meniscus.  There was some inflamed tissue and so I performed a minimal debridement in that area.  The ACL and PCL were intact.  With the knee in figure-of-four  position, the lateral compartment had no wear of the meniscus or the cartilage.  Finally, I assessed the patellofemoral joint.  There was grade III to almost grade IV chondromalacia of the trochlea groove and some underneath the undersurface of the  patella.  I also performed an abrasion chondroplasty in this area.  I then allowed fluid lavage of the knee and drained all the fluid from the knee.  Closed the portal sites with interrupted nylon suture and then inserted my mixture of morphine and  Marcaine into the knee joint  and the portal sites.  Xeroform well-padded sterile dressing was applied.  She was awakened, extubated, and taken to recovery room in stable condition.  All final counts were correct.  No complications noted.  CN/NUANCE  D:07/27/2019 T:07/27/2019 JOB:[id number]

## 2019-07-27 NOTE — Anesthesia Procedure Notes (Signed)
Procedure Name: LMA Insertion Date/Time: 07/27/2019 7:35 AM Performed by: Signe Colt, CRNA Pre-anesthesia Checklist: Patient identified, Emergency Drugs available, Suction available and Patient being monitored Patient Re-evaluated:Patient Re-evaluated prior to induction Oxygen Delivery Method: Circle system utilized Preoxygenation: Pre-oxygenation with 100% oxygen Induction Type: IV induction Ventilation: Mask ventilation without difficulty LMA: LMA inserted LMA Size: 4.0 Number of attempts: 1 Airway Equipment and Method: Bite block Placement Confirmation: positive ETCO2 Tube secured with: Tape Dental Injury: Teeth and Oropharynx as per pre-operative assessment

## 2019-07-27 NOTE — Anesthesia Postprocedure Evaluation (Signed)
Anesthesia Post Note  Patient: Maureen Hickman  Procedure(s) Performed: LEFT KNEE ARTHROSCOPY (Left Knee) CHONDROPLASTY (Left Knee)     Patient location during evaluation: PACU Anesthesia Type: General Level of consciousness: awake and alert Pain management: pain level controlled Vital Signs Assessment: post-procedure vital signs reviewed and stable Respiratory status: spontaneous breathing, nonlabored ventilation, respiratory function stable and patient connected to nasal cannula oxygen Cardiovascular status: blood pressure returned to baseline and stable Postop Assessment: no apparent nausea or vomiting Anesthetic complications: no   No complications documented.  Last Vitals:  Vitals:   07/27/19 0915 07/27/19 0945  BP: 135/83 137/83  Pulse: (!) 49 (!) 55  Resp: 15 14  Temp:  36.5 C  SpO2: 97% 100%    Last Pain:  Vitals:   07/27/19 0945  TempSrc:   PainSc: 0-No pain                 Eileen Kangas P Deetra Booton

## 2019-07-27 NOTE — Transfer of Care (Signed)
Immediate Anesthesia Transfer of Care Note  Patient: Esmond Camper  Procedure(s) Performed: LEFT KNEE ARTHROSCOPY (Left Knee) CHONDROPLASTY (Left Knee)  Patient Location: PACU  Anesthesia Type:General  Level of Consciousness: drowsy and patient cooperative  Airway & Oxygen Therapy: Patient Spontanous Breathing and Patient connected to face mask oxygen  Post-op Assessment: Report given to RN and Post -op Vital signs reviewed and stable  Post vital signs: Reviewed and stable  Last Vitals:  Vitals Value Taken Time  BP    Temp    Pulse 89 07/27/19 0813  Resp 9 07/27/19 0813  SpO2 99 % 07/27/19 0813  Vitals shown include unvalidated device data.  Last Pain:  Vitals:   07/27/19 0616  TempSrc: Oral  PainSc: 0-No pain         Complications: No complications documented.

## 2019-07-27 NOTE — H&P (Signed)
Maureen Hickman is an 54 y.o. female.   Chief Complaint:  Left knee pain with swelling, locking, catching HPI: The patient is a very pleasant 54 year old female who works in the [employer] system.  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  The knee has been buckling on her and giving way.  She has had medial joint line tenderness.  We have tried conservative treatment and on this failed a MRI was obtained of her left knee.  It did show a medial meniscal tear.  With her continued mechanical symptoms of the left knee, at this point we recommended arthroscopic intervention.  Past Medical History:  Diagnosis Date  . Anxiety   . Hypercholesterolemia   . Hypertension   . Myopia   . Seasonal allergies   . Sinusitis     Past Surgical History:  Procedure Laterality Date  . NASAL SINUS SURGERY      History reviewed. No pertinent family history. Social History:  reports that she quit smoking about 2 years ago. Her smoking use included cigarettes. She smoked 0.50 packs per day. She has never used smokeless tobacco. She reports that she does not drink alcohol and does not use drugs.  Allergies:  Allergies  Allergen Reactions  . Other Anaphylaxis    Surgical face mask caused swelling of face, lips, tongue. (Wears cotton mask without a problem)    Medications Prior to Admission  Medication Sig Dispense Refill  . ALPRAZolam (XANAX) 0.5 MG tablet Take 0.5 mg by mouth at bedtime as needed for anxiety.    . cetirizine (ZYRTEC) 10 MG tablet Take 10 mg by mouth daily.    . Cholecalciferol (VITAMIN D3 PO) Take by mouth.    [name] Ethyl (VASCEPA PO) Take by mouth.    . rosuvastatin (CRESTOR) 40 MG tablet Take 40 mg by mouth daily.    . fluticasone (FLONASE) 50 MCG/ACT nasal spray Place 1 spray into both nostrils daily. Take everyday per patient      No results found for this or any previous visit (from the past 48 hour(s)). No results  found.  Review of Systems  Musculoskeletal: Positive for joint swelling.  All other systems reviewed and are negative.   Blood pressure 127/82, pulse 66, temperature 98.3 F (36.8 C), temperature source Oral, resp. rate 18, height 5\' 3"  (1.6 m), weight 78.2 kg, last menstrual period 12/11/2010, SpO2 100 %. Physical Exam Vitals reviewed.  HENT:     Head: Normocephalic and atraumatic.  Eyes:     Pupils: Pupils are equal, round, and reactive to light.  Cardiovascular:     Rate and Rhythm: Normal rate and regular rhythm.     Pulses: Normal pulses.     Heart sounds: Normal heart sounds.  Pulmonary:     Effort: Pulmonary effort is normal.     Breath sounds: Normal breath sounds.  Abdominal:     General: Bowel sounds are normal.     Palpations: Abdomen is soft.  Musculoskeletal:     Cervical back: Normal range of motion.     Left knee: Swelling present. Tenderness present over the medial joint line. Abnormal meniscus.  Neurological:     Mental Status: She is alert and oriented to person, place, and time.  Psychiatric:        Behavior: Behavior normal.      Assessment/Plan Left knee medial meniscal tear  The plan will be to proceed to surgery today for left  knee arthroscopy with a partial medial meniscectomy.  I showed her knee model explained in detail with the surgery involves.  We talked about the risk and benefits surgery and what to expect with her interoperative and postoperative course.  All questions and concerns were answered and addressed.  Informed consent has been obtained and the left knee has been marked.  Mcarthur Rossetti, MD 07/27/2019, 7:13 AM

## 2019-07-28 ENCOUNTER — Encounter (HOSPITAL_BASED_OUTPATIENT_CLINIC_OR_DEPARTMENT_OTHER): Payer: Self-pay | Admitting: Orthopaedic Surgery

## 2019-08-03 ENCOUNTER — Other Ambulatory Visit: Payer: Self-pay

## 2019-08-03 ENCOUNTER — Ambulatory Visit (INDEPENDENT_AMBULATORY_CARE_PROVIDER_SITE_OTHER): Payer: 59 | Admitting: Physician Assistant

## 2019-08-03 ENCOUNTER — Encounter: Payer: Self-pay | Admitting: Physician Assistant

## 2019-08-03 DIAGNOSIS — Z9889 Other specified postprocedural states: Secondary | ICD-10-CM

## 2019-08-03 NOTE — Progress Notes (Signed)
HPI: [name] returns today 1 week status post left knee arthroscopy.  She states she is overall doing well states swelling is decreased.  She does have stiffness in the hip particular pain.  She was found to have a small meniscal root tear left medial meniscus underwent partial meniscectomy.  Also she was found to have grade 3 to grade IV chondromalacia medial femoral condyle and grade III chondromalacia trochlear groove.  Physical exam: Sutures were approximate port sites left knee.  Left calf supple nontender.  She has full extension and flexion to 90 degrees left knee.  Impression: 1 week status post left knee arthroscopy  Plan: She will work on range of motion and scar tissue mobilization.  Sutures removed.  She is to refrain from submerging the knee and water and till the port sites are completely healed.  She will follow-up with [place] in 1 month sooner if there is any questions concerns.  She will return to work full duties without restriction on August 14, 2019.

## 2019-08-08 MED FILL — UBRELVY 50 MG TABS: 50 | 8 days supply | Qty: 16 | Fill #1

## 2019-08-31 ENCOUNTER — Ambulatory Visit (INDEPENDENT_AMBULATORY_CARE_PROVIDER_SITE_OTHER): Payer: 59 | Admitting: Physician Assistant

## 2019-08-31 ENCOUNTER — Encounter: Payer: Self-pay | Admitting: Physician Assistant

## 2019-08-31 DIAGNOSIS — Z9889 Other specified postprocedural states: Secondary | ICD-10-CM

## 2019-08-31 NOTE — Progress Notes (Signed)
HPI: Ms. Cloyd returns today status post left knee arthroscopy 07/27/2019.  She underwent a small meniscal root tear meniscectomy she also was found to have grade 3 to grade IV chondromalacia of the medial femoral condyle and grade 3 changes trochlear groove.  She states that the soreness in her hip has gone away she is having no pain in the knee.  States she is much better than prior to surgery.  States knee overall feels good and has good range of motion with some minimal stiffness.  She is taking no medications.  Physical exam: Left knee full extension full flexion no crepitus.  No instability valgus varus stressing.  No abnormal warmth erythema.  Port sites well-healed.  No effusion knee.  Impression: Status post left knee arthroscopy 07/27/2019  Plan: Discussed with her quad strengthening exercises.  Also discussed knee friendly exercises.  Think the next step if she does develop pain in the knee is possible cortisone injection followed by a supplemental injection.  I discussed this with her at length today.  She will let us know if she develops any pain in the knee.  Follow-up as needed.

## 2019-09-12 MED FILL — ROSUVASTATIN CALCIUM 40 MG: 40 | 30 days supply | Qty: 30 | Fill #2

## 2019-09-18 ENCOUNTER — Other Ambulatory Visit (HOSPITAL_COMMUNITY): Payer: Self-pay | Admitting: Family Medicine

## 2019-09-18 DIAGNOSIS — J019 Acute sinusitis, unspecified: Secondary | ICD-10-CM | POA: Diagnosis not present

## 2019-09-18 DIAGNOSIS — F419 Anxiety disorder, unspecified: Secondary | ICD-10-CM | POA: Diagnosis not present

## 2019-09-18 DIAGNOSIS — E782 Mixed hyperlipidemia: Secondary | ICD-10-CM | POA: Diagnosis not present

## 2019-09-18 DIAGNOSIS — I10 Essential (primary) hypertension: Secondary | ICD-10-CM | POA: Diagnosis not present

## 2019-09-18 MED FILL — FLUTICASONE PROP 50 MCG SPR: 50 | 30 days supply | Qty: 16 | Fill #0

## 2019-09-18 MED FILL — AMOXICILLIN 875 MG TABS: 875 | 5 days supply | Qty: 10 | Fill #0

## 2019-09-18 MED FILL — NOREL AD TABLET: 4-10-325 | 7 days supply | Qty: 30 | Fill #0

## 2019-10-16 ENCOUNTER — Ambulatory Visit: Payer: 59 | Admitting: Physician Assistant

## 2019-10-17 ENCOUNTER — Ambulatory Visit (INDEPENDENT_AMBULATORY_CARE_PROVIDER_SITE_OTHER): Payer: 59 | Admitting: Orthopaedic Surgery

## 2019-10-17 ENCOUNTER — Encounter: Payer: Self-pay | Admitting: Orthopaedic Surgery

## 2019-10-17 ENCOUNTER — Telehealth: Payer: Self-pay | Admitting: Radiology

## 2019-10-17 DIAGNOSIS — M1712 Unilateral primary osteoarthritis, left knee: Secondary | ICD-10-CM

## 2019-10-17 MED ORDER — METHYLPREDNISOLONE ACETATE 40 MG/ML IJ SUSP
40.0000 mg | INTRAMUSCULAR | Status: AC | PRN
  Administered 2019-10-17: 40 mg via INTRA_ARTICULAR

## 2019-10-17 MED ORDER — LIDOCAINE HCL 1 % IJ SOLN
3.0000 mL | INTRAMUSCULAR | Status: AC | PRN
  Administered 2019-10-17: 3 mL

## 2019-10-17 NOTE — Progress Notes (Signed)
HPI: [name] returns today status post left knee arthroscopy 07/27/2019.  Requesting cortisone injection in the knee she has had some increased discomfort in the knee.  Again she was found to have grade 3 to grade IV chondromalacia changes of the medial femoral condyle and grade 3 changes of the trochlear groove.  She has had no new injury to the knee.  No mechanical symptoms.   Physical exam: Left knee full extension full flexion.  No abnormal warmth erythema or effusion.    Procedure Note  Patient: Maureen Hickman             Date of Birth: 11/17/1965           MRN: [id number]             Visit Date: 10/17/2019  Procedures: Visit Diagnoses: No diagnosis found.  Large Joint Inj: L knee on 10/17/2019 9:10 AM Indications: pain Details: 22 G 1.5 in needle, anterolateral approach  Arthrogram: No  Medications: 3 mL lidocaine 1 %; 40 mg methylPREDNISolone acetate 40 MG/ML Outcome: tolerated well, no immediate complications Procedure, treatment alternatives, risks and benefits explained, specific risks discussed. Consent was given by the patient. Immediately prior to procedure a time out was called to verify the correct patient, procedure, equipment, support staff and site/side marked as required. Patient was prepped and draped in the usual sterile fashion.     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  We will try to gain approval for supplemental injection of the left knee and have her back once this is available.  Questions were encouraged and answered at length.

## 2019-10-17 NOTE — Telephone Encounter (Signed)
Noted  

## 2019-10-17 NOTE — Telephone Encounter (Signed)
Left knee supplemental injection

## 2019-10-20 NOTE — Telephone Encounter (Signed)
Submitted for VOB for Monovisc-Left knee

## 2019-10-20 NOTE — Telephone Encounter (Signed)
Please submit.  Thank you. 

## 2019-10-23 ENCOUNTER — Telehealth: Payer: Self-pay

## 2019-10-23 NOTE — Telephone Encounter (Signed)
Called pt and scheduled..pt is aware of copay

## 2019-10-23 NOTE — Telephone Encounter (Signed)
Pt's insurance company covers Durolane not Monovisc-VoB of Benfits completed for Durolane   Approved for Durolane-Left knee Dr. Margarito Liner and Bill [phone] copay then covered @ 100% No prior auth required   [employer]-Ok to schedule @ next available

## 2019-10-30 MED FILL — NOREL AD TABLET: 4-10-325 | 7 days supply | Qty: 30 | Fill #1

## 2019-10-31 ENCOUNTER — Ambulatory Visit (INDEPENDENT_AMBULATORY_CARE_PROVIDER_SITE_OTHER): Payer: 59 | Admitting: Orthopaedic Surgery

## 2019-10-31 DIAGNOSIS — M1712 Unilateral primary osteoarthritis, left knee: Secondary | ICD-10-CM | POA: Diagnosis not present

## 2019-10-31 MED ORDER — SODIUM HYALURONATE 60 MG/3ML IX PRSY
60.0000 mg | PREFILLED_SYRINGE | INTRA_ARTICULAR | Status: AC | PRN
  Administered 2019-10-31: 60 mg via INTRA_ARTICULAR

## 2019-10-31 NOTE — Progress Notes (Signed)
   Procedure Note  Patient: Maureen Hickman             Date of Birth: August 21, 1965           MRN: [id number]             Visit Date: 10/31/2019 HPI: [name] returns today for Durolane injection in her left knee.  She has known osteoarthritis left knee.  She states overall the knee is doing well.  Physical exam: Left knee full extension full flexion.  No abnormal warmth erythema or effusion.  Procedures: Visit Diagnoses:  1. Primary osteoarthritis of left knee     Large Joint Inj: L knee on 10/31/2019 8:33 AM Indications: pain Details: 22 G 1.5 in needle, superolateral approach  Arthrogram: No  Medications: 60 mg Sodium Hyaluronate 60 MG/3ML Outcome: tolerated well, no immediate complications Procedure, treatment alternatives, risks and benefits explained, specific risks discussed. Consent was given by the patient. Immediately prior to procedure a time out was called to verify the correct patient, procedure, equipment, support staff and site/side marked as required. Patient was prepped and draped in the usual sterile fashion.     Plan: She will work on Forensic scientist.  Follow-up with [place] in 8 weeks to see what type of response she had to the supplemental injection.  Questions encouraged and answered

## 2019-11-01 ENCOUNTER — Other Ambulatory Visit (HOSPITAL_COMMUNITY): Payer: Self-pay | Admitting: Family Medicine

## 2019-11-01 DIAGNOSIS — I10 Essential (primary) hypertension: Secondary | ICD-10-CM | POA: Diagnosis not present

## 2019-11-01 DIAGNOSIS — G43009 Migraine without aura, not intractable, without status migrainosus: Secondary | ICD-10-CM | POA: Diagnosis not present

## 2019-11-01 DIAGNOSIS — F419 Anxiety disorder, unspecified: Secondary | ICD-10-CM | POA: Diagnosis not present

## 2019-11-01 MED FILL — UBRELVY 50 MG TABS: 50 | 30 days supply | Qty: 16 | Fill #0

## 2019-11-15 MED FILL — VASCEPA 1 GM CAPSULE: 1 | 30 days supply | Qty: 120 | Fill #1

## 2019-11-23 ENCOUNTER — Other Ambulatory Visit (HOSPITAL_COMMUNITY): Payer: Self-pay | Admitting: Family Medicine

## 2019-11-23 DIAGNOSIS — I10 Essential (primary) hypertension: Secondary | ICD-10-CM | POA: Diagnosis not present

## 2019-11-23 DIAGNOSIS — H9201 Otalgia, right ear: Secondary | ICD-10-CM | POA: Diagnosis not present

## 2019-11-23 DIAGNOSIS — G43009 Migraine without aura, not intractable, without status migrainosus: Secondary | ICD-10-CM | POA: Diagnosis not present

## 2019-11-23 DIAGNOSIS — F419 Anxiety disorder, unspecified: Secondary | ICD-10-CM | POA: Diagnosis not present

## 2019-11-23 DIAGNOSIS — R42 Dizziness and giddiness: Secondary | ICD-10-CM | POA: Diagnosis not present

## 2019-11-23 MED FILL — MECLIZINE 25 MG TABLET: 25 | 30 days supply | Qty: 120 | Fill #0

## 2019-11-23 MED FILL — predniSONE 10 MG TABS: 10 | 10 days supply | Qty: 30 | Fill #0

## 2019-12-25 DIAGNOSIS — F419 Anxiety disorder, unspecified: Secondary | ICD-10-CM | POA: Diagnosis not present

## 2019-12-25 DIAGNOSIS — G43009 Migraine without aura, not intractable, without status migrainosus: Secondary | ICD-10-CM | POA: Diagnosis not present

## 2019-12-25 DIAGNOSIS — I10 Essential (primary) hypertension: Secondary | ICD-10-CM | POA: Diagnosis not present

## 2019-12-25 DIAGNOSIS — E782 Mixed hyperlipidemia: Secondary | ICD-10-CM | POA: Diagnosis not present

## 2020-01-12 MED FILL — ROSUVASTATIN CALCIUM 40 MG: 40 | 30 days supply | Qty: 30 | Fill #3

## 2020-01-24 DIAGNOSIS — G43009 Migraine without aura, not intractable, without status migrainosus: Secondary | ICD-10-CM | POA: Diagnosis not present

## 2020-01-24 DIAGNOSIS — H9201 Otalgia, right ear: Secondary | ICD-10-CM | POA: Diagnosis not present

## 2020-01-24 DIAGNOSIS — F419 Anxiety disorder, unspecified: Secondary | ICD-10-CM | POA: Diagnosis not present

## 2020-01-24 DIAGNOSIS — E782 Mixed hyperlipidemia: Secondary | ICD-10-CM | POA: Diagnosis not present

## 2020-01-24 DIAGNOSIS — I10 Essential (primary) hypertension: Secondary | ICD-10-CM | POA: Diagnosis not present

## 2020-02-01 MED FILL — UBRELVY 50 MG TABS: 50 | 30 days supply | Qty: 16 | Fill #1

## 2020-02-06 MED FILL — predniSONE 10 MG TABS: 10 | 10 days supply | Qty: 30 | Fill #1

## 2020-02-20 DIAGNOSIS — F419 Anxiety disorder, unspecified: Secondary | ICD-10-CM | POA: Diagnosis not present

## 2020-02-20 DIAGNOSIS — Z0000 Encounter for general adult medical examination without abnormal findings: Secondary | ICD-10-CM | POA: Diagnosis not present

## 2020-02-20 DIAGNOSIS — Z13220 Encounter for screening for lipoid disorders: Secondary | ICD-10-CM | POA: Diagnosis not present

## 2020-02-20 DIAGNOSIS — Z13228 Encounter for screening for other metabolic disorders: Secondary | ICD-10-CM | POA: Diagnosis not present

## 2020-02-20 DIAGNOSIS — I10 Essential (primary) hypertension: Secondary | ICD-10-CM | POA: Diagnosis not present

## 2020-02-20 DIAGNOSIS — E782 Mixed hyperlipidemia: Secondary | ICD-10-CM | POA: Diagnosis not present

## 2020-02-20 DIAGNOSIS — Z01419 Encounter for gynecological examination (general) (routine) without abnormal findings: Secondary | ICD-10-CM | POA: Diagnosis not present

## 2020-03-11 ENCOUNTER — Other Ambulatory Visit (HOSPITAL_COMMUNITY): Payer: Self-pay | Admitting: Family Medicine

## 2020-03-11 DIAGNOSIS — G43009 Migraine without aura, not intractable, without status migrainosus: Secondary | ICD-10-CM | POA: Diagnosis not present

## 2020-03-11 DIAGNOSIS — H9203 Otalgia, bilateral: Secondary | ICD-10-CM | POA: Diagnosis not present

## 2020-03-11 DIAGNOSIS — F419 Anxiety disorder, unspecified: Secondary | ICD-10-CM | POA: Diagnosis not present

## 2020-03-11 DIAGNOSIS — I10 Essential (primary) hypertension: Secondary | ICD-10-CM | POA: Diagnosis not present

## 2020-03-11 DIAGNOSIS — D2230 Melanocytic nevi of unspecified part of face: Secondary | ICD-10-CM | POA: Diagnosis not present

## 2020-03-11 DIAGNOSIS — E782 Mixed hyperlipidemia: Secondary | ICD-10-CM | POA: Diagnosis not present

## 2020-03-11 MED FILL — predniSONE 10 MG TABS: 10 | 10 days supply | Qty: 30 | Fill #0

## 2020-03-11 MED FILL — UBRELVY 50 MG TABS: 50 | 30 days supply | Qty: 16 | Fill #0

## 2020-04-22 ENCOUNTER — Other Ambulatory Visit (HOSPITAL_COMMUNITY): Payer: Self-pay | Admitting: Family Medicine

## 2020-04-22 DIAGNOSIS — J309 Allergic rhinitis, unspecified: Secondary | ICD-10-CM | POA: Diagnosis not present

## 2020-04-22 DIAGNOSIS — G43009 Migraine without aura, not intractable, without status migrainosus: Secondary | ICD-10-CM | POA: Diagnosis not present

## 2020-04-22 DIAGNOSIS — F419 Anxiety disorder, unspecified: Secondary | ICD-10-CM | POA: Diagnosis not present

## 2020-04-22 DIAGNOSIS — E782 Mixed hyperlipidemia: Secondary | ICD-10-CM | POA: Diagnosis not present

## 2020-04-22 MED FILL — NOREL AD TABLET: 4-10-325 | 15 days supply | Qty: 60 | Fill #0

## 2020-04-30 DIAGNOSIS — F419 Anxiety disorder, unspecified: Secondary | ICD-10-CM | POA: Diagnosis not present

## 2020-04-30 DIAGNOSIS — E782 Mixed hyperlipidemia: Secondary | ICD-10-CM | POA: Diagnosis not present

## 2020-04-30 DIAGNOSIS — G43009 Migraine without aura, not intractable, without status migrainosus: Secondary | ICD-10-CM | POA: Diagnosis not present

## 2020-04-30 DIAGNOSIS — J309 Allergic rhinitis, unspecified: Secondary | ICD-10-CM | POA: Diagnosis not present

## 2020-04-30 DIAGNOSIS — I10 Essential (primary) hypertension: Secondary | ICD-10-CM | POA: Diagnosis not present

## 2020-05-27 ENCOUNTER — Other Ambulatory Visit (HOSPITAL_COMMUNITY): Payer: Self-pay

## 2020-05-27 MED FILL — Ubrogepant Tab 50 MG: ORAL | 30 days supply | Qty: 16 | Fill #0 | Status: AC

## 2020-07-01 ENCOUNTER — Other Ambulatory Visit (HOSPITAL_BASED_OUTPATIENT_CLINIC_OR_DEPARTMENT_OTHER): Payer: Self-pay

## 2020-07-01 ENCOUNTER — Other Ambulatory Visit (HOSPITAL_COMMUNITY): Payer: Self-pay

## 2020-07-01 DIAGNOSIS — F419 Anxiety disorder, unspecified: Secondary | ICD-10-CM | POA: Diagnosis not present

## 2020-07-01 DIAGNOSIS — G43009 Migraine without aura, not intractable, without status migrainosus: Secondary | ICD-10-CM | POA: Diagnosis not present

## 2020-07-01 DIAGNOSIS — H9203 Otalgia, bilateral: Secondary | ICD-10-CM | POA: Diagnosis not present

## 2020-07-01 DIAGNOSIS — E782 Mixed hyperlipidemia: Secondary | ICD-10-CM | POA: Diagnosis not present

## 2020-07-01 DIAGNOSIS — D2230 Melanocytic nevi of unspecified part of face: Secondary | ICD-10-CM | POA: Diagnosis not present

## 2020-07-01 DIAGNOSIS — H9190 Unspecified hearing loss, unspecified ear: Secondary | ICD-10-CM | POA: Diagnosis not present

## 2020-07-01 DIAGNOSIS — I10 Essential (primary) hypertension: Secondary | ICD-10-CM | POA: Diagnosis not present

## 2020-07-01 MED ORDER — UBRELVY 50 MG PO TABS
50.0000 mg | ORAL_TABLET | ORAL | 1 refills | Status: DC
  Filled 2020-07-01: qty 16, 30d supply, fill #0
  Filled 2020-08-15: qty 16, 30d supply, fill #1

## 2020-07-01 MED ORDER — ICOSAPENT ETHYL 1 G PO CAPS
2.0000 g | ORAL_CAPSULE | Freq: Two times a day (BID) | ORAL | 0 refills | Status: AC
  Filled 2020-07-01: qty 120, 30d supply, fill #0

## 2020-07-01 MED ORDER — ROSUVASTATIN CALCIUM 40 MG PO TABS
40.0000 mg | ORAL_TABLET | Freq: Every day | ORAL | 4 refills | Status: AC
  Filled 2020-07-01: qty 30, 30d supply, fill #0
  Filled 2020-10-27: qty 30, 30d supply, fill #1
  Filled 2021-02-25: qty 30, 30d supply, fill #2

## 2020-07-31 ENCOUNTER — Other Ambulatory Visit (HOSPITAL_COMMUNITY): Payer: Self-pay

## 2020-07-31 MED FILL — Prednisone Tab 10 MG: ORAL | 10 days supply | Qty: 30 | Fill #0 | Status: AC

## 2020-08-05 DIAGNOSIS — G43009 Migraine without aura, not intractable, without status migrainosus: Secondary | ICD-10-CM | POA: Diagnosis not present

## 2020-08-05 DIAGNOSIS — R21 Rash and other nonspecific skin eruption: Secondary | ICD-10-CM | POA: Diagnosis not present

## 2020-08-05 DIAGNOSIS — E782 Mixed hyperlipidemia: Secondary | ICD-10-CM | POA: Diagnosis not present

## 2020-08-05 DIAGNOSIS — F419 Anxiety disorder, unspecified: Secondary | ICD-10-CM | POA: Diagnosis not present

## 2020-08-05 DIAGNOSIS — I10 Essential (primary) hypertension: Secondary | ICD-10-CM | POA: Diagnosis not present

## 2020-08-05 DIAGNOSIS — H9203 Otalgia, bilateral: Secondary | ICD-10-CM | POA: Diagnosis not present

## 2020-08-05 DIAGNOSIS — H9190 Unspecified hearing loss, unspecified ear: Secondary | ICD-10-CM | POA: Diagnosis not present

## 2020-08-15 ENCOUNTER — Other Ambulatory Visit (HOSPITAL_COMMUNITY): Payer: Self-pay

## 2020-08-16 ENCOUNTER — Other Ambulatory Visit (HOSPITAL_COMMUNITY): Payer: Self-pay

## 2020-08-30 ENCOUNTER — Other Ambulatory Visit (HOSPITAL_COMMUNITY): Payer: Self-pay

## 2020-08-30 DIAGNOSIS — H6983 Other specified disorders of Eustachian tube, bilateral: Secondary | ICD-10-CM | POA: Diagnosis not present

## 2020-08-30 DIAGNOSIS — H93293 Other abnormal auditory perceptions, bilateral: Secondary | ICD-10-CM | POA: Diagnosis not present

## 2020-08-30 DIAGNOSIS — H9203 Otalgia, bilateral: Secondary | ICD-10-CM | POA: Diagnosis not present

## 2020-08-30 MED ORDER — METHOCARBAMOL 750 MG PO TABS
750.0000 mg | ORAL_TABLET | Freq: Every evening | ORAL | 1 refills | Status: AC
  Filled 2020-08-30: qty 20, 20d supply, fill #0
  Filled 2020-10-27: qty 20, 20d supply, fill #1

## 2020-09-02 ENCOUNTER — Other Ambulatory Visit (HOSPITAL_COMMUNITY): Payer: Self-pay

## 2020-09-02 MED ORDER — FLUTICASONE PROPIONATE 50 MCG/ACT NA SUSP
2.0000 | Freq: Every day | NASAL | 3 refills | Status: AC
Start: 2019-09-18 — End: ?
  Filled 2020-09-02: qty 16, 30d supply, fill #0

## 2020-09-09 ENCOUNTER — Other Ambulatory Visit (HOSPITAL_COMMUNITY): Payer: Self-pay

## 2020-09-09 MED FILL — Chlorphen-Phenylephrine w/ APAP Tab 4-10-325 MG: ORAL | 15 days supply | Qty: 60 | Fill #0 | Status: AC

## 2020-09-18 DIAGNOSIS — Z86010 Personal history of colonic polyps: Secondary | ICD-10-CM | POA: Diagnosis not present

## 2020-10-02 ENCOUNTER — Other Ambulatory Visit (HOSPITAL_COMMUNITY): Payer: Self-pay

## 2020-10-02 DIAGNOSIS — J309 Allergic rhinitis, unspecified: Secondary | ICD-10-CM | POA: Diagnosis not present

## 2020-10-02 DIAGNOSIS — E782 Mixed hyperlipidemia: Secondary | ICD-10-CM | POA: Diagnosis not present

## 2020-10-02 DIAGNOSIS — I10 Essential (primary) hypertension: Secondary | ICD-10-CM | POA: Diagnosis not present

## 2020-10-02 DIAGNOSIS — G43009 Migraine without aura, not intractable, without status migrainosus: Secondary | ICD-10-CM | POA: Diagnosis not present

## 2020-10-02 DIAGNOSIS — F419 Anxiety disorder, unspecified: Secondary | ICD-10-CM | POA: Diagnosis not present

## 2020-10-02 MED ORDER — UBRELVY 50 MG PO TABS
50.0000 mg | ORAL_TABLET | ORAL | 1 refills | Status: DC
  Filled 2020-10-02: qty 16, 30d supply, fill #0
  Filled 2020-12-02: qty 16, 30d supply, fill #1

## 2020-10-03 ENCOUNTER — Other Ambulatory Visit (HOSPITAL_COMMUNITY): Payer: Self-pay

## 2020-10-17 DIAGNOSIS — F419 Anxiety disorder, unspecified: Secondary | ICD-10-CM | POA: Diagnosis not present

## 2020-10-17 DIAGNOSIS — G43009 Migraine without aura, not intractable, without status migrainosus: Secondary | ICD-10-CM | POA: Diagnosis not present

## 2020-10-17 DIAGNOSIS — E782 Mixed hyperlipidemia: Secondary | ICD-10-CM | POA: Diagnosis not present

## 2020-10-17 DIAGNOSIS — I10 Essential (primary) hypertension: Secondary | ICD-10-CM | POA: Diagnosis not present

## 2020-10-28 ENCOUNTER — Other Ambulatory Visit (HOSPITAL_COMMUNITY): Payer: Self-pay

## 2020-11-17 MED FILL — Chlorphen-Phenylephrine w/ APAP Tab 4-10-325 MG: ORAL | 15 days supply | Qty: 60 | Fill #1 | Status: AC

## 2020-11-18 ENCOUNTER — Other Ambulatory Visit (HOSPITAL_COMMUNITY): Payer: Self-pay

## 2020-12-03 ENCOUNTER — Other Ambulatory Visit (HOSPITAL_COMMUNITY): Payer: Self-pay

## 2020-12-03 DIAGNOSIS — I10 Essential (primary) hypertension: Secondary | ICD-10-CM | POA: Diagnosis not present

## 2020-12-03 DIAGNOSIS — G43009 Migraine without aura, not intractable, without status migrainosus: Secondary | ICD-10-CM | POA: Diagnosis not present

## 2020-12-03 DIAGNOSIS — F419 Anxiety disorder, unspecified: Secondary | ICD-10-CM | POA: Diagnosis not present

## 2020-12-03 DIAGNOSIS — E782 Mixed hyperlipidemia: Secondary | ICD-10-CM | POA: Diagnosis not present

## 2020-12-03 DIAGNOSIS — J309 Allergic rhinitis, unspecified: Secondary | ICD-10-CM | POA: Diagnosis not present

## 2020-12-03 MED ORDER — FLUTICASONE PROPIONATE 50 MCG/ACT NA SUSP
2.0000 | Freq: Every day | NASAL | 4 refills | Status: AC
  Filled 2020-12-03: qty 16, 30d supply, fill #0

## 2021-01-01 ENCOUNTER — Other Ambulatory Visit (HOSPITAL_COMMUNITY): Payer: Self-pay

## 2021-01-01 MED FILL — Chlorphen-Phenylephrine w/ APAP Tab 4-10-325 MG: ORAL | 15 days supply | Qty: 60 | Fill #2 | Status: AC

## 2021-01-06 ENCOUNTER — Other Ambulatory Visit (HOSPITAL_COMMUNITY): Payer: Self-pay

## 2021-01-29 ENCOUNTER — Other Ambulatory Visit (HOSPITAL_COMMUNITY): Payer: Self-pay

## 2021-01-29 DIAGNOSIS — G43009 Migraine without aura, not intractable, without status migrainosus: Secondary | ICD-10-CM | POA: Diagnosis not present

## 2021-01-29 DIAGNOSIS — I10 Essential (primary) hypertension: Secondary | ICD-10-CM | POA: Diagnosis not present

## 2021-01-29 DIAGNOSIS — R220 Localized swelling, mass and lump, head: Secondary | ICD-10-CM | POA: Diagnosis not present

## 2021-01-29 DIAGNOSIS — F419 Anxiety disorder, unspecified: Secondary | ICD-10-CM | POA: Diagnosis not present

## 2021-01-29 MED ORDER — UBRELVY 50 MG PO TABS
50.0000 mg | ORAL_TABLET | ORAL | 1 refills | Status: AC
  Filled 2021-01-29: qty 16, 30d supply, fill #0

## 2021-02-11 IMAGING — MR MR KNEE*L* W/O CM
4 of 6 series · 21 of 40 positions shown · non-contrast
Comparison: Radiograph 07/21/06.

CLINICAL DATA: Lateral knee pain and instability with occasional
popping. Temporary relief from steroid injection 4 months ago. No
acute injury or prior relevant surgery.

EXAM:
MRI OF THE LEFT KNEE WITHOUT CONTRAST
TECHNIQUE: Multiplanar, multisequence MR imaging of the knee was performed. No
intravenous contrast was administered.

[Series 3: T2 fat-sat · axial · 4.0mm · 0.50mm/px · z∈[-20,+75]mm · 3 of 24 slices shown (1 of 2)]
[im 5/24]
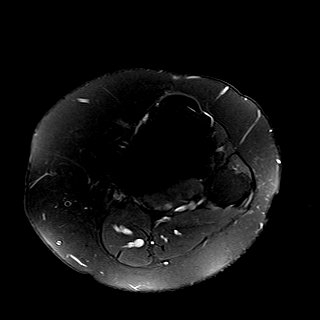
[im 14/24]
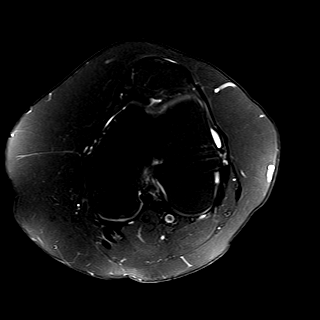
[im 24/24]
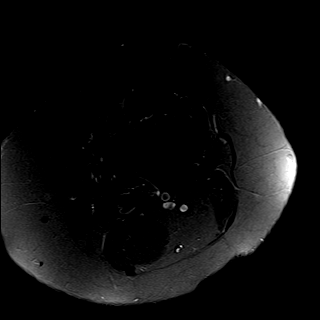

[Series 5: T2 fat-sat · coronal · 4.0mm · 0.29mm/px · 3 of 26 slices shown (2 of 2)]
[im 6/26]
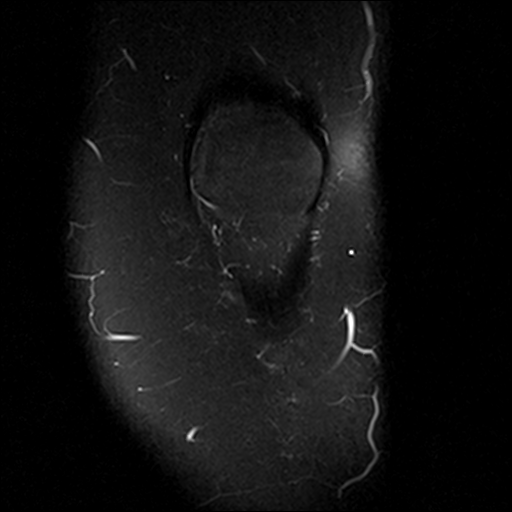
[im 16/26]
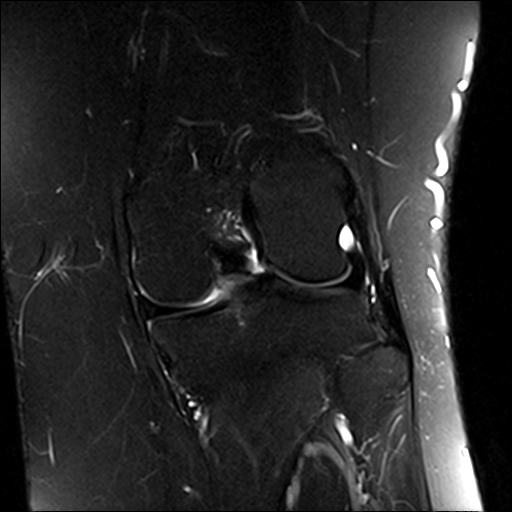
[im 26/26]
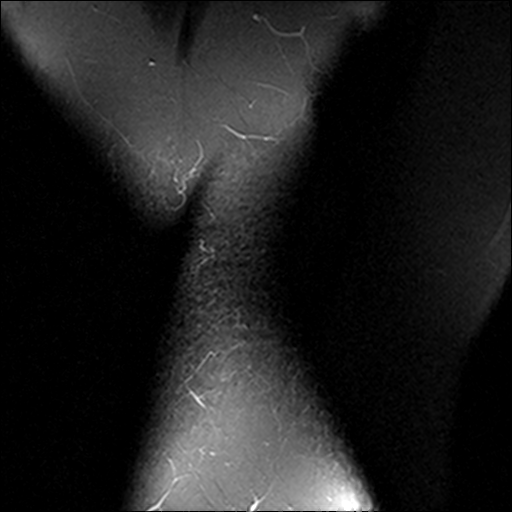

[Series 7: PD fat-sat · sagittal · 3.0mm · 0.29mm/px · 7 of 28 slices shown (1 of 2)]
[im 1/28]
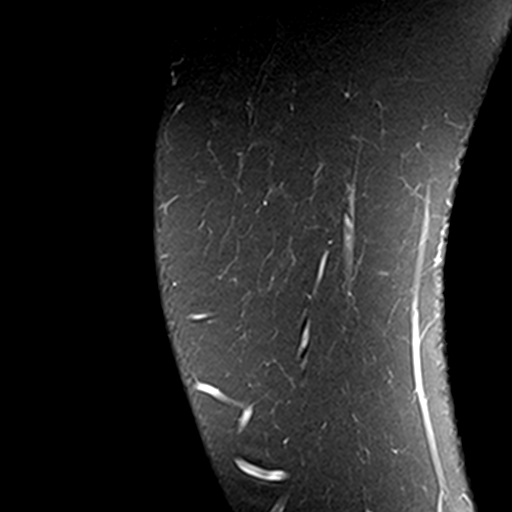
[im 5/28]
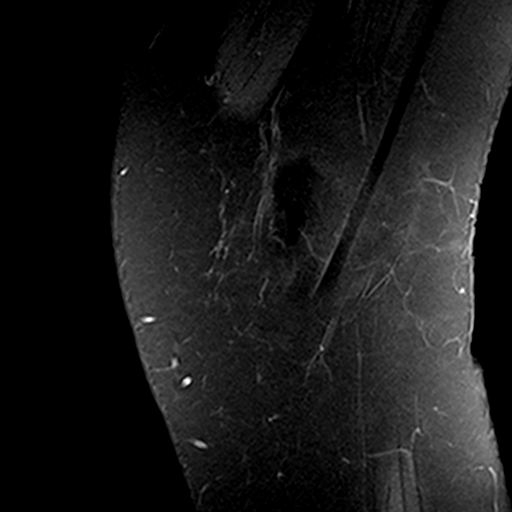
[im 10/28]
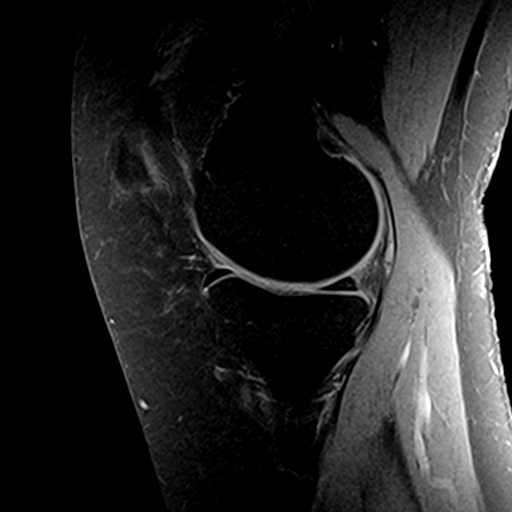
[im 14/28]
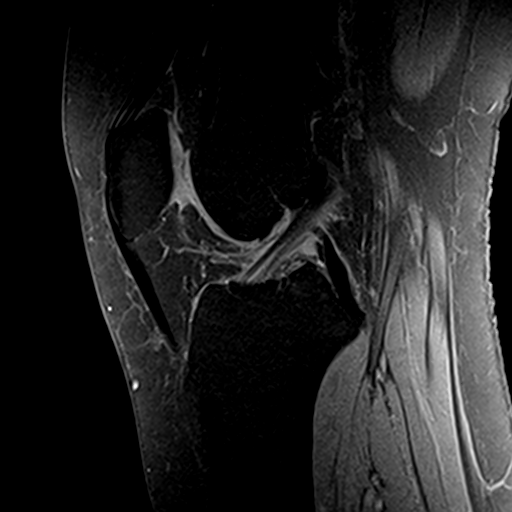
[im 19/28]
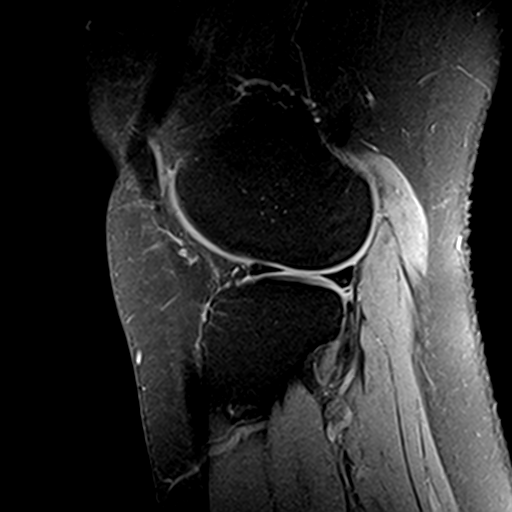
[im 23/28]
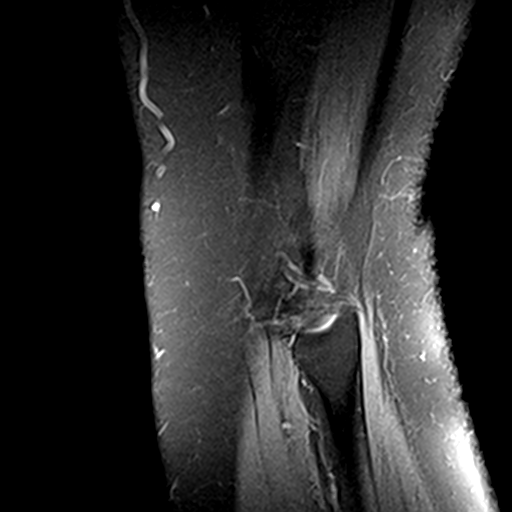
[im 28/28]
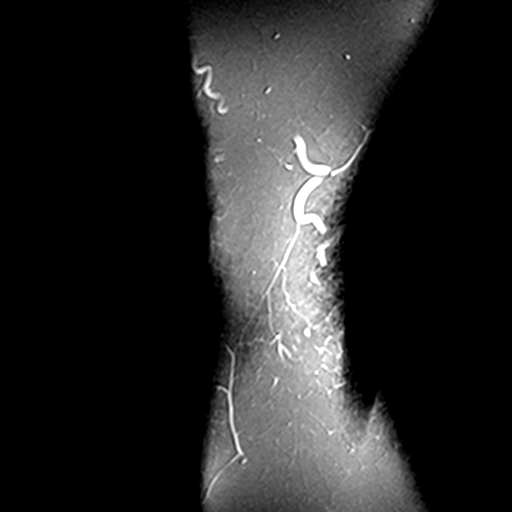

[Series 8: PD fat-sat · coronal · 3.0mm · 0.29mm/px · 8 of 32 slices shown (2 of 2)]
[im 1/32]
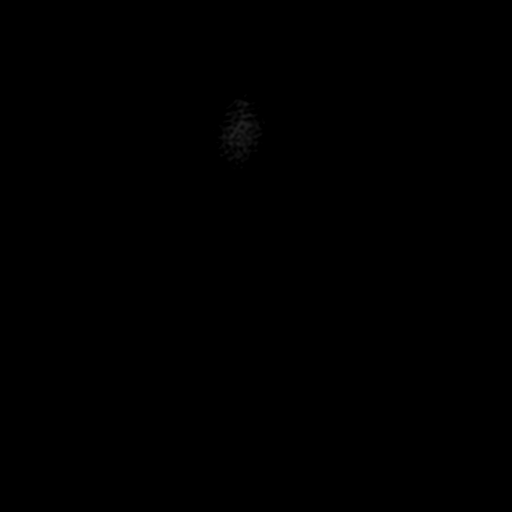
[im 5/32]
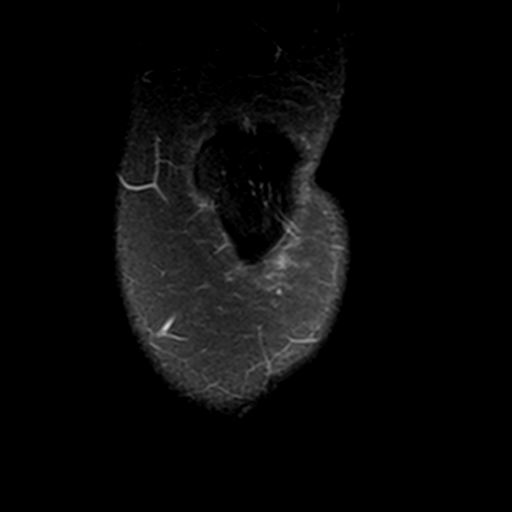
[im 9/32]
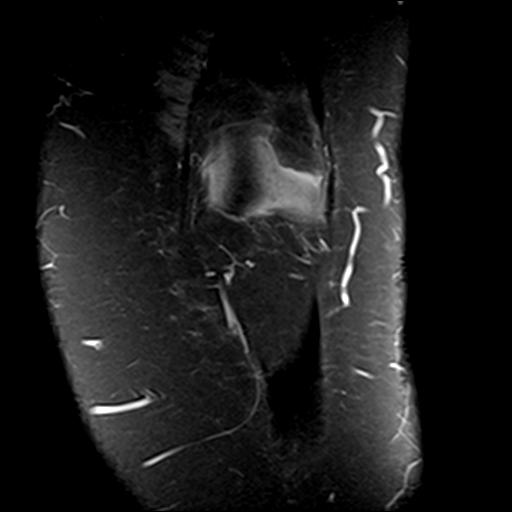
[im 14/32]
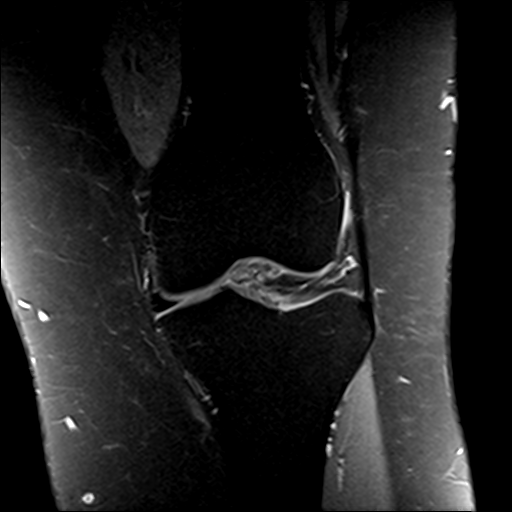
[im 18/32]
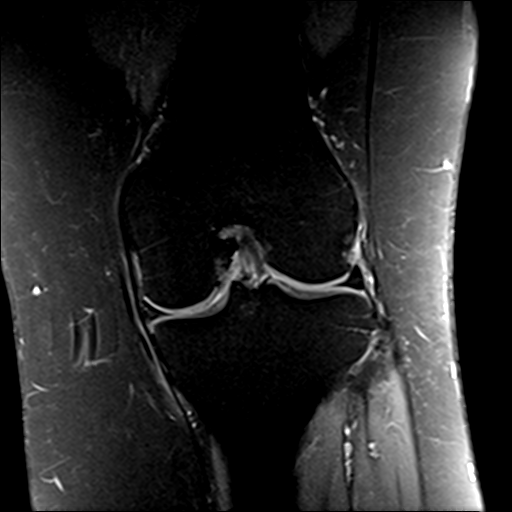
[im 23/32]
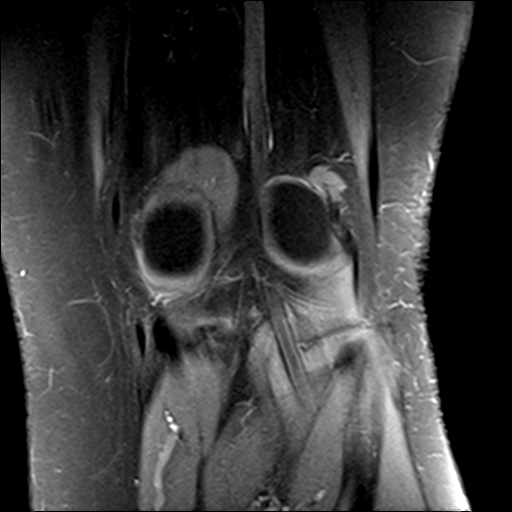
[im 27/32]
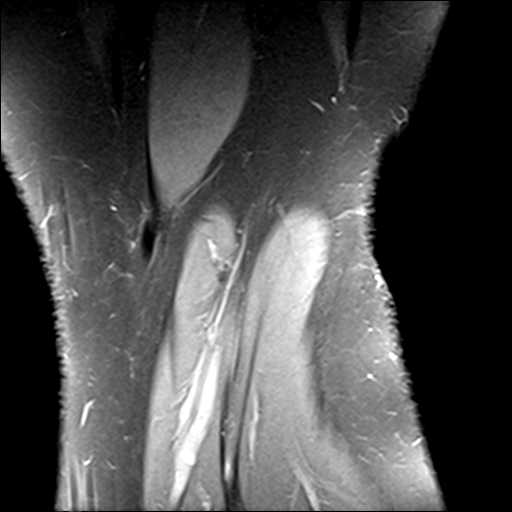
[im 32/32]
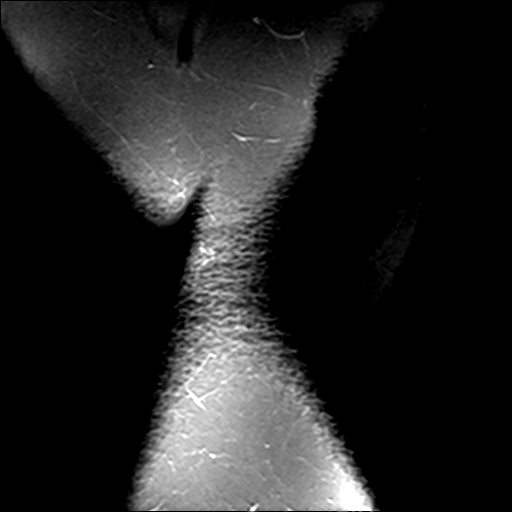

[21 of 40 positions shown; findings below may reference images not displayed]

FINDINGS: MENISCI

Medial meniscus: There is a small radial tear involving the root of
the posterior horn, best seen on the coronal and sagittal images.
There is incomplete meniscal detachment, and no centrally displaced
meniscal fragment.

Lateral meniscus:  Intact with normal morphology.

LIGAMENTS

Cruciates:  Intact.

Collaterals:  Intact.

CARTILAGE

Patellofemoral:  Preserved.

Medial:  Minimal chondral thinning without focal defect.

Lateral:  Preserved.

MISCELLANEOUS

Joint:  No significant joint effusion.

Popliteal Fossa:  Unremarkable. No significant Baker's cyst.

Extensor Mechanism:  Intact.

Bones:  No acute or significant extra-articular osseous findings.

Other: No other significant periarticular soft tissue findings.
IMPRESSION: 1. Early root tear of the posterior horn of the medial meniscus with
incomplete meniscal detachment.
2. The lateral meniscus, cruciate and collateral ligaments are
intact.
3. Minimal medial compartment degenerative chondrosis. No acute
osseous findings.

## 2021-02-25 ENCOUNTER — Other Ambulatory Visit (HOSPITAL_COMMUNITY): Payer: Self-pay

## 2021-03-20 ENCOUNTER — Ambulatory Visit: Payer: 59 | Admitting: Allergy & Immunology
